# Patient Record
Sex: Female | Born: 1955 | Race: White | Hispanic: No | Marital: Single | Smoking: Former smoker
Health system: Southern US, Community
[De-identification: ages and names within clinical notes are randomized; demographics above are authoritative.]

## PROBLEM LIST (undated history)

## (undated) DIAGNOSIS — C801 Malignant (primary) neoplasm, unspecified: Secondary | ICD-10-CM

## (undated) DIAGNOSIS — E039 Hypothyroidism, unspecified: Secondary | ICD-10-CM

## (undated) DIAGNOSIS — M1990 Unspecified osteoarthritis, unspecified site: Secondary | ICD-10-CM

## (undated) DIAGNOSIS — J189 Pneumonia, unspecified organism: Secondary | ICD-10-CM

## (undated) DIAGNOSIS — L409 Psoriasis, unspecified: Secondary | ICD-10-CM

## (undated) DIAGNOSIS — Z803 Family history of malignant neoplasm of breast: Secondary | ICD-10-CM

## (undated) DIAGNOSIS — F419 Anxiety disorder, unspecified: Secondary | ICD-10-CM

## (undated) DIAGNOSIS — S32050A Wedge compression fracture of fifth lumbar vertebra, initial encounter for closed fracture: Secondary | ICD-10-CM

## (undated) DIAGNOSIS — Z87442 Personal history of urinary calculi: Secondary | ICD-10-CM

## (undated) DIAGNOSIS — F32A Depression, unspecified: Secondary | ICD-10-CM

## (undated) DIAGNOSIS — K8020 Calculus of gallbladder without cholecystitis without obstruction: Secondary | ICD-10-CM

## (undated) DIAGNOSIS — Z7982 Long term (current) use of aspirin: Secondary | ICD-10-CM

## (undated) HISTORY — PX: WISDOM TOOTH EXTRACTION: SHX21

## (undated) HISTORY — DX: Depression, unspecified: F32.A

## (undated) HISTORY — PX: BREAST SURGERY: SHX581

## (undated) HISTORY — DX: Anxiety disorder, unspecified: F41.9

## (undated) HISTORY — PX: COLONOSCOPY: SHX174

## (undated) HISTORY — DX: Psoriasis, unspecified: L40.9

## (undated) HISTORY — PX: BACK SURGERY: SHX140

## (undated) HISTORY — DX: Family history of malignant neoplasm of breast: Z80.3

---
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
## 1978-10-18 HISTORY — PX: OTHER SURGICAL HISTORY: SHX169

## 1978-10-18 HISTORY — PX: DILATION AND CURETTAGE OF UTERUS: SHX78

## 1982-10-18 DIAGNOSIS — R87619 Unspecified abnormal cytological findings in specimens from cervix uteri: Secondary | ICD-10-CM

## 1982-10-18 HISTORY — DX: Unspecified abnormal cytological findings in specimens from cervix uteri: R87.619

## 2003-10-13 ENCOUNTER — Other Ambulatory Visit: Payer: Self-pay

## 2015-10-19 HISTORY — PX: WRIST FRACTURE SURGERY: SHX121

## 2019-10-19 HISTORY — PX: EYE SURGERY: SHX253

## 2021-02-13 ENCOUNTER — Ambulatory Visit (INDEPENDENT_AMBULATORY_CARE_PROVIDER_SITE_OTHER): Payer: Self-pay | Admitting: Advanced Practice Midwife

## 2021-02-13 ENCOUNTER — Other Ambulatory Visit: Payer: Self-pay

## 2021-02-13 ENCOUNTER — Encounter: Payer: Self-pay | Admitting: Advanced Practice Midwife

## 2021-02-13 VITALS — BP 140/80 | HR 98 | Ht 62.0 in | Wt 140.0 lb

## 2021-02-13 DIAGNOSIS — N951 Menopausal and female climacteric states: Secondary | ICD-10-CM

## 2021-02-13 DIAGNOSIS — F32A Depression, unspecified: Secondary | ICD-10-CM

## 2021-02-13 DIAGNOSIS — F419 Anxiety disorder, unspecified: Secondary | ICD-10-CM

## 2021-02-13 DIAGNOSIS — Z0000 Encounter for general adult medical examination without abnormal findings: Secondary | ICD-10-CM

## 2021-02-13 DIAGNOSIS — E039 Hypothyroidism, unspecified: Secondary | ICD-10-CM

## 2021-02-13 MED ORDER — CLONAZEPAM 1 MG PO TABS: 1.0000 mg | ORAL_TABLET | Freq: Two times a day (BID) | ORAL | 5 refills | Status: DC | PRN

## 2021-02-13 MED ORDER — LEVOTHYROXINE SODIUM 75 MCG PO TABS: 75.0000 ug | ORAL_TABLET | Freq: Every day | ORAL | 11 refills | Status: AC

## 2021-02-13 MED ORDER — MEDROXYPROGESTERONE ACETATE 2.5 MG PO TABS: 2.5000 mg | ORAL_TABLET | Freq: Every day | ORAL | 11 refills | Status: DC

## 2021-02-13 MED ORDER — DULOXETINE HCL 60 MG PO CPEP: 60.0000 mg | ORAL_CAPSULE | Freq: Every day | ORAL | 11 refills | Status: AC

## 2021-02-13 MED ORDER — EST ESTROGENS-METHYLTEST 1.25-2.5 MG PO TABS: 1.0000 | ORAL_TABLET | Freq: Every day | ORAL | 5 refills | Status: DC

## 2021-02-13 NOTE — Progress Notes (Signed)
Patient ID: Amanda Gonzales, female   DOB: 20-Nov-1955, 65 y.o.   MRN: [id number]    Subjective:  Date of Service: 02/13/2021  HPI:  Amanda Gonzales is a 65 y.o. G3 P2012 postmenopausal female being seen to establish care for the primary purpose at this time of receiving refills of her long-term medications. She has moved to the area from [place] where she was seen by [facility] for her ongoing care. Her main concerns are her anxiety, depression and menopausal symptoms-specifically hot flashes. She also has a history of hypothyroid.   She has been missing doses of most of her medications due to needing refills. She has multiple stressors and an ongoing history of anxiety and depression. She tried weaning off her HRT several years ago without success. She reports taking HRT since age 65. We discussed recommendation to take lowest dose for the shortest period of time due to risks for heart disease and cancer. She is not ready to discontinue HRT.   I also advised the patient that she needs to establish care with a PCP for further refills of mood supporting and thyroid medications as we are an OB/GYN practice doing limited primary care. I also recommended she seek the help of a therapist for ongoing mood disorders and she is not interested in that at this time. Last PAP smear was 06/13/18 and was normal. Last mammogram was in 2020 and was normal. She declines those tests at today's visit.    Past Medical History:  Diagnosis Date  . Abnormal Pap smear of cervix 1984  . Anxiety   . Depression   . Psoriasis    Family History  Problem Relation Age of Onset  . Breast cancer Brother 60  . Stroke Brother   . Thyroid disease Maternal Grandmother   . Heart failure Mother   . Heart failure Father    Past Surgical History:  Procedure Laterality Date  . APPENDECTOMY  1976  . [place]   MAB  . EYE SURGERY  2021   Cataract  . WRIST FRACTURE  SURGERY Left 2017    Short Social History:  Social History   Tobacco Use  . Smoking status: Former Smoker    Types: Cigarettes  . Smokeless tobacco: Never Used  Substance Use Topics  . Alcohol use: Yes    Alcohol/week: 1.0 standard drink    Types: 1 Cans of beer per week    Comment: Occassional    No Known Allergies  Current Outpatient Medications  Medication Sig Dispense Refill  . clonazePAM (KLONOPIN) 1 MG tablet Take 1 tablet (1 mg total) by mouth 2 (two) times daily as needed. 30 tablet 5  . DULoxetine (CYMBALTA) 60 MG capsule Take 1 capsule (60 mg total) by mouth daily. 30 capsule 11  . estrogens-methylTEST (ESTRATEST) 1.25-2.5 MG tablet Take 1 tablet by mouth daily. 30 tablet 5  . levothyroxine (SYNTHROID) 75 MCG tablet Take 1 tablet (75 mcg total) by mouth daily before breakfast. 30 tablet 11  . medroxyPROGESTERone (PROVERA) 2.5 MG tablet Take 1 tablet (2.5 mg total) by mouth daily. 30 tablet 11   No current facility-administered medications for this visit.   Review of Systems  Constitutional: Negative for chills and fever.  HENT: Positive for congestion. Negative for ear discharge, ear pain, hearing loss, sinus pain and sore throat.   Eyes: Negative for blurred vision and double vision.  Respiratory: Positive for cough. Negative for shortness  of breath and wheezing.   Cardiovascular: Negative for chest pain, palpitations and leg swelling.  Gastrointestinal: Negative for abdominal pain, blood in stool, constipation, diarrhea, heartburn, melena, nausea and vomiting.  Genitourinary: Positive for urgency. Negative for dysuria, flank pain, frequency and hematuria.  Musculoskeletal: Negative for back pain, joint pain and myalgias.  Skin: Negative for itching and rash.  Neurological: Negative for dizziness, tingling, tremors, sensory change, speech change, focal weakness, seizures, loss of consciousness, weakness and headaches.  Endo/Heme/Allergies: Negative for environmental  allergies. Does not bruise/bleed easily.       Positive for hot flashes  Psychiatric/Behavioral: Positive for depression. Negative for hallucinations, memory loss, substance abuse and suicidal ideas. The patient is not nervous/anxious and does not have insomnia.        Positive for anxiety       Objective:  Objective   Vitals:   02/13/21 1425  BP: 140/80  Pulse: 98  Weight: 140 lb (63.5 kg)  Height: 5\' 2"  (1.575 m)   Body mass index is 25.61 kg/m. Constitutional: Well nourished, well developed female in no acute physical distress.  HEENT: normal Skin: Warm and dry.  Cardiovascular: Regular rate and rhythm.   Extremity: no edema Respiratory: Clear to auscultation bilateral. Normal respiratory effort Neuro: DTRs 2+, Cranial nerves grossly intact Psych: Alert and Oriented x3. No memory deficits. Nervous/anxious  MS: normal gait, normal bilateral lower extremity ROM/strength/stability.   Data: Depression screen [facility] 2/9 02/13/2021  Decreased Interest 3  Down, Depressed, Hopeless 2  PHQ - 2 Score 5  Altered sleeping 3  Tired, decreased energy 3  Change in appetite 3  Feeling bad or failure about yourself  1  Trouble concentrating 2  Moving slowly or fidgety/restless 1  Suicidal thoughts 0  PHQ-9 Score 18  Difficult doing work/chores Very difficult   GAD 7 : Generalized Anxiety Score 02/13/2021  Nervous, Anxious, on Edge 1  Control/stop worrying 3  Worry too much - different things 3  Trouble relaxing 2  Restless 1  Easily annoyed or irritable 1  Afraid - awful might happen 1  Total GAD 7 Score 12  Anxiety Difficulty Somewhat difficult    Time of visit: 24 minutes were spent face to face in the care of this patient. Greater than 50% spent in counseling. Assessment/Plan:   65 y.o. postmenopausal female establishing care, medication refills  Rxs: Synthroid 75 mg daily before breakfast Klonopin 1 mg 2 times daily PRN Cymbalta 60 mg daily Provera 2.5 mg  daily Estratest 1.25-2.5 mg 1 tablet daily Return to clinic for annual well woman exam with PAP smear and referral for mammogram Establish care with PCP for ongoing thyroid and mental health care   [facility] 02/16/2021, 11:17 AM

## 2021-02-16 ENCOUNTER — Encounter: Payer: Self-pay | Admitting: Advanced Practice Midwife

## 2021-02-16 DIAGNOSIS — F32A Depression, unspecified: Secondary | ICD-10-CM | POA: Insufficient documentation

## 2021-02-16 DIAGNOSIS — E782 Mixed hyperlipidemia: Secondary | ICD-10-CM | POA: Insufficient documentation

## 2021-02-16 DIAGNOSIS — F419 Anxiety disorder, unspecified: Secondary | ICD-10-CM | POA: Insufficient documentation

## 2021-02-16 DIAGNOSIS — E039 Hypothyroidism, unspecified: Secondary | ICD-10-CM | POA: Insufficient documentation

## 2021-02-23 ENCOUNTER — Encounter: Payer: Self-pay | Admitting: Obstetrics and Gynecology

## 2021-04-14 ENCOUNTER — Ambulatory Visit: Payer: Self-pay | Admitting: Dermatology

## 2021-06-12 ENCOUNTER — Other Ambulatory Visit: Payer: Self-pay | Admitting: Internal Medicine

## 2021-06-12 DIAGNOSIS — Z1231 Encounter for screening mammogram for malignant neoplasm of breast: Secondary | ICD-10-CM

## 2021-09-23 ENCOUNTER — Other Ambulatory Visit: Payer: Self-pay | Admitting: Family Medicine

## 2021-09-23 ENCOUNTER — Ambulatory Visit: Payer: Self-pay

## 2021-09-23 ENCOUNTER — Other Ambulatory Visit: Payer: Self-pay | Admitting: Physical Medicine and Rehabilitation

## 2021-09-23 DIAGNOSIS — G44319 Acute post-traumatic headache, not intractable: Secondary | ICD-10-CM

## 2021-09-23 DIAGNOSIS — R519 Headache, unspecified: Secondary | ICD-10-CM

## 2021-09-24 ENCOUNTER — Other Ambulatory Visit: Payer: Self-pay | Admitting: Family Medicine

## 2021-09-24 ENCOUNTER — Ambulatory Visit: Admission: RE | Admit: 2021-09-24 | Payer: Self-pay | Source: Ambulatory Visit

## 2021-09-24 DIAGNOSIS — M4856XA Collapsed vertebra, not elsewhere classified, lumbar region, initial encounter for fracture: Secondary | ICD-10-CM

## 2021-09-28 ENCOUNTER — Other Ambulatory Visit: Payer: Self-pay | Admitting: Internal Medicine

## 2021-09-28 ENCOUNTER — Ambulatory Visit
Admission: RE | Admit: 2021-09-28 | Discharge: 2021-09-28 | Disposition: A | Discharge status: Home / Self Care | Payer: Self-pay | Source: Ambulatory Visit | Attending: Family Medicine | Admitting: Family Medicine

## 2021-09-28 ENCOUNTER — Ambulatory Visit
Admission: RE | Admit: 2021-09-28 | Discharge: 2021-09-28 | Disposition: A | Discharge status: Home / Self Care | Payer: Self-pay | Source: Ambulatory Visit | Attending: Internal Medicine | Admitting: Internal Medicine

## 2021-09-28 DIAGNOSIS — Z1389 Encounter for screening for other disorder: Secondary | ICD-10-CM

## 2021-09-28 DIAGNOSIS — Y939 Activity, unspecified: Secondary | ICD-10-CM | POA: Insufficient documentation

## 2021-09-28 DIAGNOSIS — Y929 Unspecified place or not applicable: Secondary | ICD-10-CM | POA: Insufficient documentation

## 2021-09-28 DIAGNOSIS — M4856XA Collapsed vertebra, not elsewhere classified, lumbar region, initial encounter for fracture: Secondary | ICD-10-CM | POA: Insufficient documentation

## 2021-09-28 DIAGNOSIS — R519 Headache, unspecified: Secondary | ICD-10-CM | POA: Insufficient documentation

## 2021-10-01 ENCOUNTER — Other Ambulatory Visit: Payer: Self-pay | Admitting: Internal Medicine

## 2021-10-01 DIAGNOSIS — Z1231 Encounter for screening mammogram for malignant neoplasm of breast: Secondary | ICD-10-CM

## 2021-10-13 ENCOUNTER — Other Ambulatory Visit: Payer: Self-pay | Admitting: Internal Medicine

## 2021-10-13 DIAGNOSIS — T8543XA Leakage of breast prosthesis and implant, initial encounter: Secondary | ICD-10-CM

## 2021-11-09 ENCOUNTER — Other Ambulatory Visit: Payer: Self-pay | Admitting: Neurosurgery

## 2021-11-09 DIAGNOSIS — S32000A Wedge compression fracture of unspecified lumbar vertebra, initial encounter for closed fracture: Secondary | ICD-10-CM

## 2021-11-12 ENCOUNTER — Ambulatory Visit
Admission: RE | Admit: 2021-11-12 | Discharge: 2021-11-12 | Disposition: A | Discharge status: Home / Self Care | Payer: Self-pay | Source: Ambulatory Visit | Attending: Neurosurgery | Admitting: Neurosurgery

## 2021-11-12 ENCOUNTER — Other Ambulatory Visit: Payer: Self-pay | Admitting: Interventional Radiology

## 2021-11-12 DIAGNOSIS — S32000A Wedge compression fracture of unspecified lumbar vertebra, initial encounter for closed fracture: Secondary | ICD-10-CM

## 2021-11-12 HISTORY — PX: IR RADIOLOGIST EVAL & MGMT: IMG5224
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
## 2021-11-13 ENCOUNTER — Other Ambulatory Visit: Payer: Self-pay | Admitting: Interventional Radiology

## 2021-11-13 DIAGNOSIS — M8458XS Pathological fracture in neoplastic disease, other specified site, sequela: Secondary | ICD-10-CM

## 2021-11-13 DIAGNOSIS — M8458XA Pathological fracture in neoplastic disease, other specified site, initial encounter for fracture: Secondary | ICD-10-CM

## 2021-11-13 DIAGNOSIS — S32050B Wedge compression fracture of fifth lumbar vertebra, initial encounter for open fracture: Secondary | ICD-10-CM

## 2021-11-13 DIAGNOSIS — S32000A Wedge compression fracture of unspecified lumbar vertebra, initial encounter for closed fracture: Secondary | ICD-10-CM

## 2021-11-13 DIAGNOSIS — M8088XA Other osteoporosis with current pathological fracture, vertebra(e), initial encounter for fracture: Secondary | ICD-10-CM

## 2021-11-13 DIAGNOSIS — S32050A Wedge compression fracture of fifth lumbar vertebra, initial encounter for closed fracture: Secondary | ICD-10-CM

## 2021-11-16 NOTE — Progress Notes (Signed)
Patient on schedule for Kyphoplasty/Lumbar 11/20/2021, called and spoke with patient on phone with pre procedure instructions given.made aware to be here @ 0730, NPO after MN, as well as driver post procedure/recovery/discharge. Plan on being here for procedure/recovery at least 4 hours. Stated understanding.

## 2021-11-19 ENCOUNTER — Other Ambulatory Visit: Payer: Self-pay | Admitting: Student
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
## 2021-11-20 ENCOUNTER — Ambulatory Visit
Admission: RE | Admit: 2021-11-20 | Discharge: 2021-11-20 | Disposition: A | Discharge status: Home / Self Care | Payer: 59 | Source: Ambulatory Visit | Attending: Interventional Radiology | Admitting: Interventional Radiology

## 2021-11-20 ENCOUNTER — Encounter: Payer: Self-pay | Admitting: Radiology

## 2021-11-20 ENCOUNTER — Other Ambulatory Visit: Payer: Self-pay

## 2021-11-20 DIAGNOSIS — S32000A Wedge compression fracture of unspecified lumbar vertebra, initial encounter for closed fracture: Secondary | ICD-10-CM

## 2021-11-20 DIAGNOSIS — M8088XA Other osteoporosis with current pathological fracture, vertebra(e), initial encounter for fracture: Secondary | ICD-10-CM

## 2021-11-20 DIAGNOSIS — M4856XA Collapsed vertebra, not elsewhere classified, lumbar region, initial encounter for fracture: Secondary | ICD-10-CM | POA: Insufficient documentation

## 2021-11-20 HISTORY — PX: IR KYPHO LUMBAR INC FX REDUCE BONE BX UNI/BIL CANNULATION INC/IMAGING: IMG5519

## 2021-11-20 LAB — BASIC METABOLIC PANEL
Anion gap: 8 (ref 5–15)
BUN: 18 mg/dL (ref 8–23)
CO2: 25 mmol/L (ref 22–32)
Calcium: 8.8 mg/dL — ABNORMAL LOW (ref 8.9–10.3)
Chloride: 106 mmol/L (ref 98–111)
Creatinine, Ser: 0.78 mg/dL (ref 0.44–1.00)
GFR, Estimated: >60 mL/min (ref >60)
Glucose, Bld: 106 mg/dL — ABNORMAL HIGH (ref 70–99)
Potassium: 4 mmol/L (ref 3.5–5.1)
Sodium: 139 mmol/L (ref 135–145)

## 2021-11-20 LAB — CBC
HCT: 42.9 % (ref 36.0–46.0)
Hemoglobin: 14.7 g/dL (ref 12.0–15.0)
MCH: 32 pg (ref 26.0–34.0)
MCHC: 34.3 g/dL (ref 30.0–36.0)
MCV: 93.3 fL (ref 80.0–100.0)
Platelets: 320 10*3/uL (ref 150–400)
RBC: 4.6 MIL/uL (ref 3.87–5.11)
RDW: 12.9 % (ref 11.5–15.5)
WBC: 7.5 10*3/uL (ref 4.0–10.5)
nRBC: 0 % (ref 0.0–0.2)

## 2021-11-20 LAB — PROTIME-INR
INR: 0.9 (ref 0.8–1.2)
Prothrombin Time: 12.2 seconds (ref 11.4–15.2)

## 2021-11-20 MED ORDER — MIDAZOLAM HCL 2 MG/2ML IJ SOLN
INTRAMUSCULAR | Status: AC
  Filled 2021-11-20: qty 2

## 2021-11-20 MED ORDER — FENTANYL CITRATE (PF) 100 MCG/2ML IJ SOLN
INTRAMUSCULAR | Status: AC
  Filled 2021-11-20: qty 2

## 2021-11-20 MED ORDER — HYDROCODONE-ACETAMINOPHEN 5-325 MG PO TABS
ORAL_TABLET | ORAL | Status: AC
  Filled 2021-11-20: qty 1

## 2021-11-20 MED ORDER — MIDAZOLAM HCL 2 MG/2ML IJ SOLN
INTRAMUSCULAR | Status: AC | PRN
  Administered 2021-11-20 (×3): 1 mg via INTRAVENOUS

## 2021-11-20 MED ORDER — CEFAZOLIN SODIUM-DEXTROSE 2-4 GM/100ML-% IV SOLN
2.0000 g | INTRAVENOUS | Status: AC
  Administered 2021-11-20: 2 g via INTRAVENOUS
  Filled 2021-11-20: qty 100

## 2021-11-20 MED ORDER — HYDROCODONE-ACETAMINOPHEN 5-325 MG PO TABS
1.0000 | ORAL_TABLET | Freq: Once | ORAL | Status: AC
  Administered 2021-11-20: 1 via ORAL
  Filled 2021-11-20: qty 1

## 2021-11-20 MED ORDER — LIDOCAINE HCL (PF) 1 % IJ SOLN
INTRAMUSCULAR | Status: AC
  Administered 2021-11-20: 30 mL
  Filled 2021-11-20: qty 30

## 2021-11-20 MED ORDER — SODIUM CHLORIDE 0.9 % IV SOLN
INTRAVENOUS | Status: DC
  Filled 2021-11-20: qty 1000

## 2021-11-20 MED ORDER — FENTANYL CITRATE (PF) 100 MCG/2ML IJ SOLN
INTRAMUSCULAR | Status: AC | PRN
  Administered 2021-11-20: 50 ug via INTRAVENOUS
  Administered 2021-11-20: 25 ug via INTRAVENOUS
  Administered 2021-11-20: 50 ug via INTRAVENOUS
  Administered 2021-11-20: 25 ug via INTRAVENOUS
  Administered 2021-11-20: 50 ug via INTRAVENOUS

## 2021-11-20 MED ORDER — CEFAZOLIN SODIUM-DEXTROSE 2-4 GM/100ML-% IV SOLN
INTRAVENOUS | Status: AC
  Filled 2021-11-20: qty 100

## 2021-11-20 NOTE — Procedures (Signed)
Interventional Radiology Procedure Note  Date of Procedure: 11/20/2021  Procedure: L5 kyphoplasty   Findings:  1. Successful L5 kyphoplasty using a bipedicular approach    Complications: No immediate complications noted.   Estimated Blood Loss: minimal  Follow-up and Recommendations: 1. Bedrest 2 hours    Albin Felling, MD  Vascular & Interventional Radiology  11/20/2021 10:10 AM

## 2021-11-20 NOTE — Progress Notes (Signed)
Patient clinically stable post L5 Kypholplasty, awake entire procedure despite received Versed 3 mg along with Fentanyl 200 mcg IV for procedure with functional iv. Vitals stable.  Successful procedue per DR El-Abd. Report given at bedside to [place] in specials.

## 2021-11-23 ENCOUNTER — Other Ambulatory Visit: Payer: Self-pay | Admitting: Interventional Radiology

## 2021-11-23 DIAGNOSIS — S32000A Wedge compression fracture of unspecified lumbar vertebra, initial encounter for closed fracture: Secondary | ICD-10-CM
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
## 2021-11-26 ENCOUNTER — Other Ambulatory Visit: Payer: Self-pay | Admitting: Advanced Practice Midwife

## 2021-11-26 ENCOUNTER — Other Ambulatory Visit: Payer: Self-pay

## 2021-11-26 DIAGNOSIS — Z0000 Encounter for general adult medical examination without abnormal findings: Secondary | ICD-10-CM

## 2021-11-26 DIAGNOSIS — N951 Menopausal and female climacteric states: Secondary | ICD-10-CM

## 2021-11-26 MED ORDER — EST ESTROGENS-METHYLTEST DS 1.25-2.5 MG PO TABS: 1.0000 | ORAL_TABLET | Freq: Every day | ORAL | 0 refills | Status: DC

## 2021-12-01 ENCOUNTER — Other Ambulatory Visit: Payer: Self-pay | Admitting: Advanced Practice Midwife

## 2021-12-01 DIAGNOSIS — N951 Menopausal and female climacteric states: Secondary | ICD-10-CM

## 2021-12-01 DIAGNOSIS — Z0000 Encounter for general adult medical examination without abnormal findings: Secondary | ICD-10-CM

## 2021-12-03 ENCOUNTER — Other Ambulatory Visit: Payer: Self-pay

## 2021-12-03 ENCOUNTER — Other Ambulatory Visit: Payer: Self-pay | Admitting: Advanced Practice Midwife

## 2021-12-03 ENCOUNTER — Ambulatory Visit
Admission: RE | Admit: 2021-12-03 | Discharge: 2021-12-03 | Disposition: A | Discharge status: Home / Self Care | Payer: 59 | Source: Ambulatory Visit | Attending: Interventional Radiology | Admitting: Interventional Radiology

## 2021-12-03 DIAGNOSIS — Z0000 Encounter for general adult medical examination without abnormal findings: Secondary | ICD-10-CM

## 2021-12-03 DIAGNOSIS — N951 Menopausal and female climacteric states: Secondary | ICD-10-CM

## 2021-12-03 DIAGNOSIS — S32000A Wedge compression fracture of unspecified lumbar vertebra, initial encounter for closed fracture: Secondary | ICD-10-CM

## 2021-12-03 HISTORY — PX: IR RADIOLOGIST EVAL & MGMT: IMG5224
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
## 2021-12-04 ENCOUNTER — Ambulatory Visit: Payer: Self-pay

## 2021-12-04 ENCOUNTER — Other Ambulatory Visit: Payer: Self-pay

## 2021-12-04 ENCOUNTER — Ambulatory Visit
Admission: RE | Admit: 2021-12-04 | Discharge: 2021-12-04 | Disposition: A | Discharge status: Home / Self Care | Payer: 59 | Source: Ambulatory Visit | Attending: Internal Medicine | Admitting: Internal Medicine

## 2021-12-04 ENCOUNTER — Other Ambulatory Visit: Payer: Self-pay | Admitting: Advanced Practice Midwife

## 2021-12-04 DIAGNOSIS — Z0000 Encounter for general adult medical examination without abnormal findings: Secondary | ICD-10-CM

## 2021-12-04 DIAGNOSIS — N951 Menopausal and female climacteric states: Secondary | ICD-10-CM

## 2021-12-04 DIAGNOSIS — T8543XA Leakage of breast prosthesis and implant, initial encounter: Secondary | ICD-10-CM

## 2021-12-04 MED ORDER — EST ESTROGENS-METHYLTEST DS 1.25-2.5 MG PO TABS: 1.0000 | ORAL_TABLET | Freq: Every day | ORAL | 0 refills | Status: DC

## 2021-12-04 NOTE — Progress Notes (Signed)
Refill methyltest 1 month supply

## 2021-12-14 ENCOUNTER — Other Ambulatory Visit: Payer: Self-pay

## 2021-12-17 ENCOUNTER — Other Ambulatory Visit: Payer: Self-pay | Admitting: Internal Medicine

## 2021-12-17 DIAGNOSIS — T8543XA Leakage of breast prosthesis and implant, initial encounter: Secondary | ICD-10-CM

## 2021-12-18 ENCOUNTER — Other Ambulatory Visit: Payer: Self-pay | Admitting: Neurosurgery

## 2021-12-18 DIAGNOSIS — S32050D Wedge compression fracture of fifth lumbar vertebra, subsequent encounter for fracture with routine healing: Secondary | ICD-10-CM

## 2021-12-24 ENCOUNTER — Other Ambulatory Visit: Payer: Self-pay

## 2021-12-24 ENCOUNTER — Ambulatory Visit (INDEPENDENT_AMBULATORY_CARE_PROVIDER_SITE_OTHER): Payer: Self-pay | Admitting: Plastic Surgery

## 2021-12-24 VITALS — BP 152/84 | HR 97 | Ht 62.0 in | Wt 143.0 lb

## 2021-12-24 DIAGNOSIS — T8543XA Leakage of breast prosthesis and implant, initial encounter: Secondary | ICD-10-CM

## 2021-12-24 DIAGNOSIS — Z719 Counseling, unspecified: Secondary | ICD-10-CM
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
## 2021-12-25 ENCOUNTER — Ambulatory Visit
Admission: RE | Admit: 2021-12-25 | Discharge: 2021-12-25 | Disposition: A | Discharge status: Home / Self Care | Payer: Medicare Other | Source: Ambulatory Visit | Attending: Neurosurgery | Admitting: Neurosurgery

## 2021-12-25 DIAGNOSIS — S32050D Wedge compression fracture of fifth lumbar vertebra, subsequent encounter for fracture with routine healing: Secondary | ICD-10-CM | POA: Diagnosis not present
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
## 2022-01-02 ENCOUNTER — Other Ambulatory Visit: Payer: Self-pay | Admitting: Advanced Practice Midwife

## 2022-01-02 DIAGNOSIS — Z0000 Encounter for general adult medical examination without abnormal findings: Secondary | ICD-10-CM

## 2022-01-02 DIAGNOSIS — N951 Menopausal and female climacteric states: Secondary | ICD-10-CM

## 2022-01-06 ENCOUNTER — Other Ambulatory Visit: Payer: Self-pay | Admitting: Advanced Practice Midwife

## 2022-01-06 ENCOUNTER — Encounter: Payer: 59 | Admitting: Physician Assistant

## 2022-01-06 DIAGNOSIS — N951 Menopausal and female climacteric states: Secondary | ICD-10-CM

## 2022-01-06 DIAGNOSIS — Z0000 Encounter for general adult medical examination without abnormal findings: Secondary | ICD-10-CM

## 2022-01-20 ENCOUNTER — Encounter: Payer: 59 | Admitting: Plastic Surgery

## 2022-02-04 ENCOUNTER — Telehealth: Payer: Self-pay

## 2022-02-04 NOTE — Telephone Encounter (Signed)
Successfully faxed requested information to Maryan Char at [phone] on 02/04/2022. ?

## 2022-02-15 HISTORY — PX: OTHER SURGICAL HISTORY: SHX169

## 2022-03-01 ENCOUNTER — Other Ambulatory Visit: Payer: Self-pay | Admitting: Neurosurgery

## 2022-03-01 DIAGNOSIS — Z01818 Encounter for other preprocedural examination: Secondary | ICD-10-CM

## 2022-03-05 ENCOUNTER — Encounter
Admission: RE | Admit: 2022-03-05 | Discharge: 2022-03-05 | Disposition: A | Discharge status: Home / Self Care | Payer: Medicare Other | Source: Ambulatory Visit | Attending: Neurosurgery | Admitting: Neurosurgery

## 2022-03-05 VITALS — BP 140/94 | Resp 18

## 2022-03-05 DIAGNOSIS — Z01818 Encounter for other preprocedural examination: Secondary | ICD-10-CM

## 2022-03-05 DIAGNOSIS — Z01812 Encounter for preprocedural laboratory examination: Secondary | ICD-10-CM | POA: Insufficient documentation

## 2022-03-05 DIAGNOSIS — Z01810 Encounter for preprocedural cardiovascular examination: Secondary | ICD-10-CM

## 2022-03-05 DIAGNOSIS — Z7982 Long term (current) use of aspirin: Secondary | ICD-10-CM

## 2022-03-05 DIAGNOSIS — Z79899 Other long term (current) drug therapy: Secondary | ICD-10-CM | POA: Insufficient documentation

## 2022-03-05 DIAGNOSIS — E039 Hypothyroidism, unspecified: Secondary | ICD-10-CM

## 2022-03-05 DIAGNOSIS — S32050A Wedge compression fracture of fifth lumbar vertebra, initial encounter for closed fracture: Secondary | ICD-10-CM

## 2022-03-05 HISTORY — DX: Unspecified osteoarthritis, unspecified site: M19.90

## 2022-03-05 HISTORY — DX: Wedge compression fracture of fifth lumbar vertebra, initial encounter for closed fracture: S32.050A

## 2022-03-05 HISTORY — DX: Personal history of urinary calculi: Z87.442

## 2022-03-05 HISTORY — DX: Long term (current) use of aspirin: Z79.82

## 2022-03-05 HISTORY — DX: Hypothyroidism, unspecified: E03.9

## 2022-03-05 HISTORY — DX: Malignant (primary) neoplasm, unspecified: C80.1

## 2022-03-05 HISTORY — DX: Pneumonia, unspecified organism: J18.9

## 2022-03-05 LAB — CBC
HCT: 46.8 % — ABNORMAL HIGH (ref 36.0–46.0)
Hemoglobin: 15.7 g/dL — ABNORMAL HIGH (ref 12.0–15.0)
MCH: 31.5 pg (ref 26.0–34.0)
MCHC: 33.5 g/dL (ref 30.0–36.0)
MCV: 94 fL (ref 80.0–100.0)
Platelets: 371 10*3/uL (ref 150–400)
RBC: 4.98 MIL/uL (ref 3.87–5.11)
RDW: 13.1 % (ref 11.5–15.5)
WBC: 7.6 10*3/uL (ref 4.0–10.5)
nRBC: 0 % (ref 0.0–0.2)

## 2022-03-05 LAB — TYPE AND SCREEN
ABO/RH(D): O POS
Antibody Screen: NEGATIVE

## 2022-03-05 LAB — URINALYSIS, COMPLETE (UACMP) WITH MICROSCOPIC
Bacteria, UA: NONE SEEN
Bilirubin Urine: NEGATIVE
Glucose, UA: NEGATIVE mg/dL
Hgb urine dipstick: NEGATIVE
Ketones, ur: NEGATIVE mg/dL
Leukocytes,Ua: NEGATIVE
Nitrite: NEGATIVE
Protein, ur: NEGATIVE mg/dL
Specific Gravity, Urine: 1.005 (ref 1.005–1.030)
pH: 6 (ref 5.0–8.0)

## 2022-03-05 LAB — BASIC METABOLIC PANEL
Anion gap: 8 (ref 5–15)
BUN: 11 mg/dL (ref 8–23)
CO2: 26 mmol/L (ref 22–32)
Calcium: 9 mg/dL (ref 8.9–10.3)
Chloride: 105 mmol/L (ref 98–111)
Creatinine, Ser: 0.87 mg/dL (ref 0.44–1.00)
GFR, Estimated: >60 mL/min (ref >60)
Glucose, Bld: 98 mg/dL (ref 70–99)
Potassium: 3.9 mmol/L (ref 3.5–5.1)
Sodium: 139 mmol/L (ref 135–145)

## 2022-03-05 NOTE — Patient Instructions (Addendum)
Your procedure is scheduled on: 03/17/22 - Wednesday Report to the Registration Desk on the 1st floor of the [facility]. To find out your arrival time, please call [phone] between 1PM - 3PM on: 03/16/22 - Tuesday If your arrival time is 6:00 am, do not arrive prior to that time as the [facility] entrance doors do not open until 6:00 am.  REMEMBER: Instructions that are not followed completely may result in serious medical risk, up to and including death; or upon the discretion of your surgeon and anesthesiologist your surgery may need to be rescheduled.  Do not eat food after midnight the night before surgery.  No gum chewing, lozengers or hard candies.  You may however, drink CLEAR liquids up to 2 hours before you are scheduled to arrive for your surgery. Do not drink anything within 2 hours of your scheduled arrival time.  Clear liquids include: - water  - apple juice without pulp - gatorade (not RED colors) - black coffee or tea (Do NOT add milk or creamers to the coffee or tea) Do NOT drink anything that is not on this list.  TAKE THESE MEDICATIONS THE MORNING OF SURGERY WITH A SIP OF WATER:  - DULoxetine (CYMBALTA) 60 MG capsule  - levothyroxine (SYNTHROID) 75 MCG tablet  One week prior to surgery: Stop Anti-inflammatories (NSAIDS) such as Advil, Aleve, Ibuprofen, Motrin, Naproxen, Naprosyn and Aspirin based products such as Excedrin, Goodys Powder, BC Powder.  Stop ANY OVER THE COUNTER supplements until after surgery.  You may take Tylenol if needed for pain up until the day of surgery.  No Alcohol for 24 hours before or after surgery.  No Smoking including e-cigarettes for 24 hours prior to surgery.  No chewable tobacco products for at least 6 hours prior to surgery.  No nicotine patches on the day of surgery.  Do not use any "recreational" drugs for at least a week prior to your surgery.  Please be advised that the combination of cocaine and anesthesia may  have negative outcomes, up to and including death. If you test positive for cocaine, your surgery will be cancelled.  On the morning of surgery brush your teeth with toothpaste and water, you may rinse your mouth with mouthwash if you wish. Do not swallow any toothpaste or mouthwash.  Use CHG Soap or wipes as directed on instruction sheet.  Do not wear jewelry, make-up, hairpins, clips or nail polish.  Do not wear lotions, powders, or perfumes.   Do not shave body from the neck down 48 hours prior to surgery just in case you cut yourself which could leave a site for infection.  Also, freshly shaved skin may become irritated if using the CHG soap.  Contact lenses, hearing aids and dentures may not be worn into surgery.  Do not bring valuables to the hospital. [facility] is not responsible for any missing/lost belongings or valuables.   Notify your doctor if there is any change in your medical condition (cold, fever, infection).  Wear comfortable clothing (specific to your surgery type) to the hospital.  After surgery, you can help prevent lung complications by doing breathing exercises.  Take deep breaths and cough every 1-2 hours. Your doctor may order a device called an Incentive Spirometer to help you take deep breaths. When coughing or sneezing, hold a pillow firmly against your incision with both hands. This is called "splinting." Doing this helps protect your incision. It also decreases belly discomfort.  If you are being admitted  to the hospital overnight, leave your suitcase in the car. After surgery it may be brought to your room.  If you are being discharged the day of surgery, you will not be allowed to drive home. You will need a responsible adult (18 years or older) to drive you home and stay with you that night.   If you are taking public transportation, you will need to have a responsible adult (18 years or older) with you. Please confirm with your physician that it  is acceptable to use public transportation.   Please call the [facility] at [phone] if you have any questions about these instructions.  Surgery Visitation Policy:  Patients undergoing a surgery or procedure may have two family members or support persons with them as long as the person is not COVID-19 positive or experiencing its symptoms.   Inpatient Visitation:    Visiting hours are 7 a.m. to 8 p.m. Up to four visitors are allowed at one time in a patient room, including children. The visitors may rotate out with other people during the day. One designated support person (adult) may remain overnight.

## 2022-03-08 LAB — SURGICAL PCR SCREEN
MRSA, PCR: POSITIVE — AB
Staphylococcus aureus: POSITIVE — AB

## 2022-03-08 NOTE — Progress Notes (Signed)
  Perioperative Services  Abnormal Lab Notification and Treatment Plan of Care  Date: 03/08/22  Name: Amanda Gonzales MRN:   [id number]  Re: Abnormal labs noted during PAT appointment  Provider Notified: Meade Maw, MD Notification mode: Routed and/or faxed via [place] of concern: Lab Results  Component Value Date   STAPHAUREUS POSITIVE (A) 03/05/2022   MRSAPCR POSITIVE (A) 03/05/2022    Notes: Patient is scheduled for a LEFT L5-S1 FAR LATERAL DISCECTOMY (Left) on 03/17/2022. She is scheduled to receive CEFAZOLIN pre-operatively. Surgical PCR (+) for MRSA; see above.  PLANS:  Review renal function. CrCl cannot be calculated (Unknown ideal weight.). Review allergies. No documented allergy to vancomycin. Order added for VANCOMYCIN 1 GRAM IV to current preoperative prophylactic regimen.  Patient with orders for both CEFAZOLIN + VANCOMYCIN to be given in the setting of documented MRSA (+) surgical PCR.   Guidelines suggest that a beta-lactam antibiotic (first or second generation cephalosporin) should be added for activity against gram-negative organisms.  Vancomycin appears to be less effective than cefazolin for preventing SSIs caused by MSSA. For this reason, the use of vancomycin in combination with cefazolin is favored for prevention of SSI due to MRSA and coagulase-negative staphylococci.  Notify primary attending surgeon of (+) MRSA result and that order has been placed for the Vancomycin by perioperative APP.   Honor Loh, MSN, APRN, FNP-C, CEN [facility]  Peri-operative Services Nurse Practitioner Phone: [phone] 03/08/22 8:41 AM

## 2022-03-17 ENCOUNTER — Encounter: Payer: Self-pay | Admitting: Neurosurgery

## 2022-03-17 ENCOUNTER — Ambulatory Visit: Payer: Medicare Other | Admitting: Urgent Care

## 2022-03-17 ENCOUNTER — Ambulatory Visit: Payer: Medicare Other

## 2022-03-17 ENCOUNTER — Other Ambulatory Visit: Payer: Self-pay

## 2022-03-17 ENCOUNTER — Encounter
Admission: RE | Disposition: A | Discharge status: Home / Self Care | Payer: Self-pay | Source: Ambulatory Visit | Attending: Neurosurgery

## 2022-03-17 ENCOUNTER — Ambulatory Visit
Admission: RE | Admit: 2022-03-17 | Discharge: 2022-03-17 | Disposition: A | Discharge status: Home / Self Care | Payer: Medicare Other | Source: Ambulatory Visit | Attending: Neurosurgery | Admitting: Neurosurgery

## 2022-03-17 DIAGNOSIS — M5117 Intervertebral disc disorders with radiculopathy, lumbosacral region: Secondary | ICD-10-CM | POA: Insufficient documentation

## 2022-03-17 DIAGNOSIS — Z87891 Personal history of nicotine dependence: Secondary | ICD-10-CM | POA: Diagnosis not present

## 2022-03-17 DIAGNOSIS — Z01810 Encounter for preprocedural cardiovascular examination: Secondary | ICD-10-CM

## 2022-03-17 DIAGNOSIS — Z7982 Long term (current) use of aspirin: Secondary | ICD-10-CM

## 2022-03-17 DIAGNOSIS — Z01812 Encounter for preprocedural laboratory examination: Secondary | ICD-10-CM

## 2022-03-17 DIAGNOSIS — M5416 Radiculopathy, lumbar region: Secondary | ICD-10-CM | POA: Diagnosis present

## 2022-03-17 DIAGNOSIS — S32050A Wedge compression fracture of fifth lumbar vertebra, initial encounter for closed fracture: Secondary | ICD-10-CM

## 2022-03-17 DIAGNOSIS — E039 Hypothyroidism, unspecified: Secondary | ICD-10-CM

## 2022-03-17 LAB — ABO/RH: ABO/RH(D): O POS

## 2022-03-17 SURGERY — FAR LATERAL DECOMPRESSION 1 LEVEL
Anesthesia: General | Site: Back | Laterality: Left

## 2022-03-17 MED ORDER — SODIUM CHLORIDE (PF) 0.9 % IJ SOLN
INTRAMUSCULAR | Status: DC | PRN
  Administered 2022-03-17: 60 mL

## 2022-03-17 MED ORDER — EPHEDRINE 5 MG/ML INJ
INTRAVENOUS | Status: AC
  Filled 2022-03-17: qty 5

## 2022-03-17 MED ORDER — ONDANSETRON HCL 4 MG/2ML IJ SOLN
4.0000 mg | Freq: Once | INTRAMUSCULAR | Status: AC | PRN
  Administered 2022-03-17: 4 mg via INTRAVENOUS

## 2022-03-17 MED ORDER — KETAMINE HCL 50 MG/5ML IJ SOSY
PREFILLED_SYRINGE | INTRAMUSCULAR | Status: AC
  Filled 2022-03-17: qty 5

## 2022-03-17 MED ORDER — LACTATED RINGERS IV SOLN: INTRAVENOUS | Status: DC

## 2022-03-17 MED ORDER — OXYCODONE HCL 5 MG PO TABS
ORAL_TABLET | ORAL | Status: AC
  Filled 2022-03-17: qty 1

## 2022-03-17 MED ORDER — SURGIFLO WITH THROMBIN (HEMOSTATIC MATRIX KIT) OPTIME
TOPICAL | Status: DC | PRN
  Administered 2022-03-17: 1 via TOPICAL

## 2022-03-17 MED ORDER — DEXAMETHASONE SODIUM PHOSPHATE 10 MG/ML IJ SOLN
INTRAMUSCULAR | Status: AC
  Filled 2022-03-17: qty 1

## 2022-03-17 MED ORDER — SENNA 8.6 MG PO TABS: 1.0000 | ORAL_TABLET | Freq: Every day | ORAL | 0 refills | Status: DC | PRN

## 2022-03-17 MED ORDER — ROCURONIUM BROMIDE 100 MG/10ML IV SOLN
INTRAVENOUS | Status: DC | PRN
  Administered 2022-03-17: 50 mg via INTRAVENOUS

## 2022-03-17 MED ORDER — MIDAZOLAM HCL 2 MG/2ML IJ SOLN
INTRAMUSCULAR | Status: DC | PRN
  Administered 2022-03-17: 2 mg via INTRAVENOUS

## 2022-03-17 MED ORDER — LIDOCAINE HCL (CARDIAC) PF 100 MG/5ML IV SOSY
PREFILLED_SYRINGE | INTRAVENOUS | Status: DC | PRN
  Administered 2022-03-17: 60 mg via INTRAVENOUS

## 2022-03-17 MED ORDER — DEXAMETHASONE SODIUM PHOSPHATE 10 MG/ML IJ SOLN
INTRAMUSCULAR | Status: DC | PRN
  Administered 2022-03-17: 10 mg via INTRAVENOUS

## 2022-03-17 MED ORDER — ONDANSETRON HCL 4 MG/2ML IJ SOLN
INTRAMUSCULAR | Status: AC
  Filled 2022-03-17: qty 2

## 2022-03-17 MED ORDER — FENTANYL CITRATE (PF) 100 MCG/2ML IJ SOLN
INTRAMUSCULAR | Status: DC | PRN
Start: 2022-03-17 — End: 2022-03-17
  Administered 2022-03-17 (×2): 50 ug via INTRAVENOUS

## 2022-03-17 MED ORDER — VANCOMYCIN HCL IN DEXTROSE 1-5 GM/200ML-% IV SOLN
1000.0000 mg | Freq: Once | INTRAVENOUS | Status: AC
  Administered 2022-03-17: 1000 mg via INTRAVENOUS

## 2022-03-17 MED ORDER — BUPIVACAINE-EPINEPHRINE (PF) 0.5% -1:200000 IJ SOLN
INTRAMUSCULAR | Status: DC | PRN
  Administered 2022-03-17: 4 mL

## 2022-03-17 MED ORDER — ONDANSETRON HCL 4 MG/2ML IJ SOLN
INTRAMUSCULAR | Status: DC | PRN
  Administered 2022-03-17: 4 mg via INTRAVENOUS

## 2022-03-17 MED ORDER — CEFAZOLIN SODIUM-DEXTROSE 2-4 GM/100ML-% IV SOLN
2.0000 g | INTRAVENOUS | Status: AC
Start: 2022-03-17 — End: 2022-03-17
  Administered 2022-03-17: 2 g via INTRAVENOUS

## 2022-03-17 MED ORDER — OXYCODONE-ACETAMINOPHEN 5-325 MG PO TABS: 1.0000 | ORAL_TABLET | ORAL | 0 refills | Status: AC | PRN

## 2022-03-17 MED ORDER — DEXAMETHASONE SODIUM PHOSPHATE 10 MG/ML IJ SOLN
INTRAMUSCULAR | Status: AC
Start: 2022-03-17 — End: ?
  Filled 2022-03-17: qty 1

## 2022-03-17 MED ORDER — FAMOTIDINE 20 MG PO TABS: 20.0000 mg | ORAL_TABLET | Freq: Once | ORAL | Status: AC

## 2022-03-17 MED ORDER — PHENYLEPHRINE 80 MCG/ML (10ML) SYRINGE FOR IV PUSH (FOR BLOOD PRESSURE SUPPORT)
PREFILLED_SYRINGE | INTRAVENOUS | Status: AC
  Filled 2022-03-17: qty 10

## 2022-03-17 MED ORDER — 0.9 % SODIUM CHLORIDE (POUR BTL) OPTIME
TOPICAL | Status: DC | PRN
  Administered 2022-03-17: 500 mL

## 2022-03-17 MED ORDER — MIDAZOLAM HCL 2 MG/2ML IJ SOLN
INTRAMUSCULAR | Status: AC
Start: 2022-03-17 — End: ?
  Filled 2022-03-17: qty 2

## 2022-03-17 MED ORDER — PHENYLEPHRINE HCL (PRESSORS) 10 MG/ML IV SOLN
INTRAVENOUS | Status: DC | PRN
  Administered 2022-03-17 (×4): 160 ug via INTRAVENOUS

## 2022-03-17 MED ORDER — METHYLPREDNISOLONE ACETATE 40 MG/ML IJ SUSP
INTRAMUSCULAR | Status: DC | PRN
  Administered 2022-03-17: 40 mg

## 2022-03-17 MED ORDER — KETAMINE HCL 10 MG/ML IJ SOLN
INTRAMUSCULAR | Status: DC | PRN
  Administered 2022-03-17: 30 mg via INTRAVENOUS

## 2022-03-17 MED ORDER — VANCOMYCIN HCL IN DEXTROSE 1-5 GM/200ML-% IV SOLN
INTRAVENOUS | Status: AC
  Filled 2022-03-17: qty 200

## 2022-03-17 MED ORDER — EPHEDRINE SULFATE (PRESSORS) 50 MG/ML IJ SOLN
INTRAMUSCULAR | Status: DC | PRN
  Administered 2022-03-17 (×2): 10 mg via INTRAVENOUS

## 2022-03-17 MED ORDER — FENTANYL CITRATE (PF) 100 MCG/2ML IJ SOLN
INTRAMUSCULAR | Status: AC
  Filled 2022-03-17: qty 2

## 2022-03-17 MED ORDER — BUPIVACAINE-EPINEPHRINE (PF) 0.5% -1:200000 IJ SOLN
INTRAMUSCULAR | Status: AC
Start: 2022-03-17 — End: ?
  Filled 2022-03-17: qty 30

## 2022-03-17 MED ORDER — OXYCODONE HCL 5 MG/5ML PO SOLN: 5.0000 mg | Freq: Once | ORAL | Status: AC | PRN

## 2022-03-17 MED ORDER — ACETAMINOPHEN 10 MG/ML IV SOLN
1000.0000 mg | Freq: Once | INTRAVENOUS | Status: DC | PRN
  Administered 2022-03-17: 1000 mg via INTRAVENOUS

## 2022-03-17 MED ORDER — FENTANYL CITRATE (PF) 100 MCG/2ML IJ SOLN
INTRAMUSCULAR | Status: AC
  Administered 2022-03-17: 25 ug via INTRAVENOUS
  Filled 2022-03-17: qty 2

## 2022-03-17 MED ORDER — CHLORHEXIDINE GLUCONATE 0.12 % MT SOLN
OROMUCOSAL | Status: AC
  Administered 2022-03-17: 15 mL via OROMUCOSAL
  Filled 2022-03-17: qty 15

## 2022-03-17 MED ORDER — OXYCODONE HCL 5 MG PO TABS
5.0000 mg | ORAL_TABLET | Freq: Once | ORAL | Status: AC | PRN
  Administered 2022-03-17: 5 mg via ORAL

## 2022-03-17 MED ORDER — FAMOTIDINE 20 MG PO TABS
ORAL_TABLET | ORAL | Status: AC
  Administered 2022-03-17: 20 mg via ORAL
  Filled 2022-03-17: qty 1

## 2022-03-17 MED ORDER — ORAL CARE MOUTH RINSE: 15.0000 mL | Freq: Once | OROMUCOSAL | Status: AC

## 2022-03-17 MED ORDER — SUGAMMADEX SODIUM 200 MG/2ML IV SOLN
INTRAVENOUS | Status: DC | PRN
  Administered 2022-03-17: 200 mg via INTRAVENOUS

## 2022-03-17 MED ORDER — BUPIVACAINE HCL 0.5 % IJ SOLN: INTRAMUSCULAR | Status: DC | PRN

## 2022-03-17 MED ORDER — PROPOFOL 10 MG/ML IV BOLUS
INTRAVENOUS | Status: AC
  Filled 2022-03-17: qty 20

## 2022-03-17 MED ORDER — CHLORHEXIDINE GLUCONATE 0.12 % MT SOLN: 15.0000 mL | Freq: Once | OROMUCOSAL | Status: AC

## 2022-03-17 MED ORDER — METHYLPREDNISOLONE ACETATE 40 MG/ML IJ SUSP
INTRAMUSCULAR | Status: AC
  Filled 2022-03-17: qty 1

## 2022-03-17 MED ORDER — ACETAMINOPHEN 10 MG/ML IV SOLN
INTRAVENOUS | Status: AC
  Filled 2022-03-17: qty 100

## 2022-03-17 MED ORDER — PROPOFOL 10 MG/ML IV BOLUS
INTRAVENOUS | Status: DC | PRN
  Administered 2022-03-17: 30 mg via INTRAVENOUS
  Administered 2022-03-17: 50 mg via INTRAVENOUS
  Administered 2022-03-17: 120 mg via INTRAVENOUS

## 2022-03-17 MED ORDER — CEFAZOLIN SODIUM-DEXTROSE 2-4 GM/100ML-% IV SOLN
INTRAVENOUS | Status: AC
  Filled 2022-03-17: qty 100

## 2022-03-17 MED ORDER — FENTANYL CITRATE (PF) 100 MCG/2ML IJ SOLN
25.0000 ug | INTRAMUSCULAR | Status: DC | PRN
  Administered 2022-03-17: 50 ug via INTRAVENOUS
  Administered 2022-03-17: 25 ug via INTRAVENOUS
  Administered 2022-03-17: 50 ug via INTRAVENOUS

## 2022-03-17 MED ORDER — DEXMEDETOMIDINE (PRECEDEX) IN NS 20 MCG/5ML (4 MCG/ML) IV SYRINGE
PREFILLED_SYRINGE | INTRAVENOUS | Status: DC | PRN
  Administered 2022-03-17: 8 ug via INTRAVENOUS

## 2022-03-17 SURGICAL SUPPLY — 62 items
ADH SKN CLS APL DERMABOND .7 (GAUZE/BANDAGES/DRESSINGS) ×1
AGENT HMST KT MTR STRL THRMB (HEMOSTASIS) ×1
APL PRP STRL LF DISP 70% ISPRP (MISCELLANEOUS) ×2
BUR NEURO DRILL SOFT 3.0X3.8M (BURR) ×2 IMPLANT
CHLORAPREP W/TINT 26 (MISCELLANEOUS) ×4 IMPLANT
CNTNR SPEC 2.5X3XGRAD LEK (MISCELLANEOUS) ×1
CONT SPEC 4OZ STER OR WHT (MISCELLANEOUS) ×1
CONT SPEC 4OZ STRL OR WHT (MISCELLANEOUS) ×1
CONTAINER SPEC 2.5X3XGRAD LEK (MISCELLANEOUS) ×1 IMPLANT
COUNTER NEEDLE 20/40 LG (NEEDLE) ×2 IMPLANT
CUP MEDICINE 2OZ PLAST GRAD ST (MISCELLANEOUS) ×4 IMPLANT
DERMABOND ADVANCED (GAUZE/BANDAGES/DRESSINGS) ×1
DERMABOND ADVANCED .7 DNX12 (GAUZE/BANDAGES/DRESSINGS) ×1 IMPLANT
DRAPE C ARM PK CFD 31 SPINE (DRAPES) ×2 IMPLANT
DRAPE LAPAROTOMY 100X77 ABD (DRAPES) ×2 IMPLANT
DRAPE MICROSCOPE SPINE 48X150 (DRAPES) ×2 IMPLANT
DRAPE SURG 17X11 SM STRL (DRAPES) ×2 IMPLANT
DRSG OPSITE POSTOP 3X4 (GAUZE/BANDAGES/DRESSINGS) ×1 IMPLANT
ELECT CAUTERY BLADE TIP 2.5 (TIP) ×2
ELECT EZSTD 165MM 6.5IN (MISCELLANEOUS)
ELECT REM PT RETURN 9FT ADLT (ELECTROSURGICAL) ×2
ELECTRODE CAUTERY BLDE TIP 2.5 (TIP) ×1 IMPLANT
ELECTRODE EZSTD 165MM 6.5IN (MISCELLANEOUS) IMPLANT
ELECTRODE REM PT RTRN 9FT ADLT (ELECTROSURGICAL) ×1 IMPLANT
GLOVE BIOGEL PI IND STRL 6.5 (GLOVE) ×1 IMPLANT
GLOVE BIOGEL PI IND STRL 8.5 (GLOVE) ×1 IMPLANT
GLOVE BIOGEL PI INDICATOR 6.5 (GLOVE) ×1
GLOVE BIOGEL PI INDICATOR 8.5 (GLOVE) ×1
GLOVE SURG SYN 6.5 ES PF (GLOVE) ×4 IMPLANT
GLOVE SURG SYN 6.5 PF PI (GLOVE) ×2 IMPLANT
GLOVE SURG SYN 8.5  E (GLOVE) ×6
GLOVE SURG SYN 8.5 E (GLOVE) ×3 IMPLANT
GLOVE SURG SYN 8.5 PF PI (GLOVE) ×3 IMPLANT
GOWN SRG LRG LVL 4 IMPRV REINF (GOWNS) ×1 IMPLANT
GOWN SRG XL LVL 3 NONREINFORCE (GOWNS) ×1 IMPLANT
GOWN STRL NON-REIN TWL XL LVL3 (GOWNS) ×2
GOWN STRL REIN LRG LVL4 (GOWNS) ×2
GRADUATE 1200CC STRL 31836 (MISCELLANEOUS) ×2 IMPLANT
GRAFT DURAGEN MATRIX 1WX1L (Tissue) IMPLANT
KIT SPINAL PRONEVIEW (KITS) ×2 IMPLANT
MANIFOLD NEPTUNE II (INSTRUMENTS) ×2 IMPLANT
MARKER SKIN DUAL TIP RULER LAB (MISCELLANEOUS) ×4 IMPLANT
NDL SAFETY ECLIPSE 18X1.5 (NEEDLE) ×1 IMPLANT
NEEDLE HYPO 18GX1.5 SHARP (NEEDLE) ×2
NEEDLE HYPO 22GX1.5 SAFETY (NEEDLE) ×2 IMPLANT
NS IRRIG 1000ML POUR BTL (IV SOLUTION) ×1 IMPLANT
NS IRRIG 500ML POUR BTL (IV SOLUTION) ×1 IMPLANT
PACK LAMINECTOMY NEURO (CUSTOM PROCEDURE TRAY) ×2 IMPLANT
PAD ARMBOARD 7.5X6 YLW CONV (MISCELLANEOUS) ×2 IMPLANT
SOLUTION IRRIG SURGIPHOR (IV SOLUTION) ×1 IMPLANT
SURGIFLO W/THROMBIN 8M KIT (HEMOSTASIS) ×2 IMPLANT
SUT DVC VLOC 3-0 CL 6 P-12 (SUTURE) ×2 IMPLANT
SUT VIC AB 0 CT1 27 (SUTURE) ×2
SUT VIC AB 0 CT1 27XCR 8 STRN (SUTURE) ×1 IMPLANT
SUT VIC AB 2-0 CT1 18 (SUTURE) ×2 IMPLANT
SYR 10ML LL (SYRINGE) ×2 IMPLANT
SYR 20ML LL LF (SYRINGE) ×2 IMPLANT
SYR 30ML LL (SYRINGE) ×4 IMPLANT
SYR 3ML LL SCALE MARK (SYRINGE) ×2 IMPLANT
TOWEL OR 17X26 4PK STRL BLUE (TOWEL DISPOSABLE) ×6 IMPLANT
TUBING CONNECTING 10 (TUBING) ×2 IMPLANT
WATER STERILE IRR 1000ML POUR (IV SOLUTION) ×1 IMPLANT

## 2022-03-17 NOTE — Discharge Instructions (Addendum)
Your surgeon has performed an operation on your lumbar spine (low back) to relieve pressure on one or more nerves. Many times, patients feel better immediately after surgery and can "overdo it." Even if you feel well, it is important that you follow these activity guidelines. If you do not let your back heal properly from the surgery, you can increase the chance of a disc herniation and/or return of your symptoms. The following are instructions to help in your recovery once you have been discharged from the hospital.  * Hold Aspirin per pre-op instructions  Activity    No bending, lifting, or twisting ("BLT"). Avoid lifting objects heavier than 10 pounds (gallon milk jug).  Where possible, avoid household activities that involve lifting, bending, pushing, or pulling such as laundry, vacuuming, grocery shopping, and childcare. Try to arrange for help from friends and family for these activities while your back heals.  Increase physical activity slowly as tolerated.  Taking short walks is encouraged, but avoid strenuous exercise. Do not jog, run, bicycle, lift weights, or participate in any other exercises unless specifically allowed by your doctor. Avoid prolonged sitting, including car rides.  Talk to your doctor before resuming sexual activity.  You should not drive until cleared by your doctor.  Until released by your doctor, you should not return to work or school.  You should rest at home and let your body heal.   You may shower two days after your surgery.  After showering, lightly dab your incision dry. Do not take a tub bath or go swimming for 3 weeks, or until approved by your doctor at your follow-up appointment.  If you smoke, we strongly recommend that you quit.  Smoking has been proven to interfere with normal healing in your back and will dramatically reduce the success rate of your surgery. Please contact [facility] (800-QUIT-NOW) and use the resources at www.[facility].com for  assistance in stopping smoking.  Surgical Incision   If you have a dressing on your incision, you may remove it three days after your surgery. Keep your incision area clean and dry.  If you have staples or stitches on your incision, you should have a follow up scheduled for removal. If you do not have staples or stitches, you will have steri-strips (small pieces of surgical tape) or Dermabond glue. The steri-strips/glue should begin to peel away within about a week (it is fine if the steri-strips fall off before then). If the strips are still in place one week after your surgery, you may gently remove them.  Diet            You may return to your usual diet. Be sure to stay hydrated.  When to Contact us  Although your surgery and recovery will likely be uneventful, you may have some residual numbness, aches, and pains in your back and/or legs. This is normal and should improve in the next few weeks.  However, should you experience any of the following, contact us immediately: New numbness or weakness Pain that is progressively getting worse, and is not relieved by your pain medications or rest Bleeding, redness, swelling, pain, or drainage from surgical incision Chills or flu-like symptoms Fever greater than 101.0 F (38.3 C) Problems with bowel or bladder functions Difficulty breathing or shortness of breath Warmth, tenderness, or swelling in your calf  Contact Information During office hours (Monday-Friday 9 am to 5 pm), please call your physician at [phone] After hours and weekends, please call [phone] and speak with the  answering service, who will contact the doctor on call.  If that fails, call the [place] Operator at [phone] and ask for the Neurosurgery Resident On Call  For a life-threatening emergency, call [place]   The drugs that you were given will stay in your system until tomorrow so for the next 24 hours you should  not:  Drive an automobile Make any legal decisions Drink any alcoholic beverage   You may resume regular meals tomorrow.  Today it is better to start with liquids and gradually work up to solid foods.  You may eat anything you prefer, but it is better to start with liquids, then soup and crackers, and gradually work up to solid foods.   Please notify your doctor immediately if you have any unusual bleeding, trouble breathing, redness and pain at the surgery site, drainage, fever, or pain not relieved by medication.    Additional Instructions:        Please contact your physician with any problems or Same Day Surgery at [phone], Monday through Friday 6 am to 4 pm, or [place] at [facility] number at [phone].

## 2022-03-17 NOTE — Anesthesia Preprocedure Evaluation (Addendum)
Anesthesia Evaluation  Patient identified by MRN, date of birth, ID band Patient awake    Reviewed: Allergy & Precautions, NPO status , Patient's Chart, lab work & pertinent test results  History of Anesthesia Complications Negative for: history of anesthetic complications  Airway Mallampati: III   Neck ROM: Full    Dental  (+) Partial Lower   Pulmonary former smoker (quit approx 7 years ago),    Pulmonary exam normal breath sounds clear to auscultation       Cardiovascular Exercise Tolerance: Good Normal cardiovascular exam Rhythm:Regular Rate:Normal  ECG 03/05/22:  Normal sinus rhythm Indeterminate axis   Neuro/Psych PSYCHIATRIC DISORDERS Anxiety Depression negative neurological ROS     GI/Hepatic negative GI ROS,   Endo/Other  Hypothyroidism   Renal/GU Renal disease (nephrolithiasis)     Musculoskeletal  (+) Arthritis ,   Abdominal   Peds  Hematology negative hematology ROS (+)   Anesthesia Other Findings   Reproductive/Obstetrics Cervical CA                            Anesthesia Physical Anesthesia Plan  ASA: 2  Anesthesia Plan: General   Post-op Pain Management:    Induction: Intravenous  PONV Risk Score and Plan: 3 and Ondansetron, Dexamethasone and Treatment may vary due to age or medical condition  Airway Management Planned: Oral ETT  Additional Equipment:   Intra-op Plan:   Post-operative Plan: Extubation in OR  Informed Consent: I have reviewed the patients History and Physical, chart, labs and discussed the procedure including the risks, benefits and alternatives for the proposed anesthesia with the patient or authorized representative who has indicated his/her understanding and acceptance.     Dental advisory given  Plan Discussed with: CRNA  Anesthesia Plan Comments: (Patient consented for risks of anesthesia including but not limited to:  - adverse  reactions to medications - damage to eyes, teeth, lips or other oral mucosa - nerve damage due to positioning  - sore throat or hoarseness - damage to heart, brain, nerves, lungs, other parts of body or loss of life  Informed patient about role of CRNA in peri- and intra-operative care.  Patient voiced understanding.)        Anesthesia Quick Evaluation

## 2022-03-17 NOTE — Progress Notes (Signed)
Patient ambulated, voided 200 ml. And drank fluids.

## 2022-03-17 NOTE — Anesthesia Procedure Notes (Signed)
Procedure Name: Intubation Date/Time: 03/17/2022 1:15 PM Performed by: Kerri Perches, CRNA Pre-anesthesia Checklist: Patient identified, Patient being monitored, Timeout performed, Emergency Drugs available and Suction available Patient Re-evaluated:Patient Re-evaluated prior to induction Oxygen Delivery Method: Circle system utilized Preoxygenation: Pre-oxygenation with 100% oxygen Induction Type: IV induction Ventilation: Mask ventilation without difficulty and Oral airway inserted - appropriate to patient size Laryngoscope Size: Mac and 3 Grade View: Grade I Tube type: Oral Tube size: 7.0 mm Number of attempts: 1 Airway Equipment and Method: Stylet Placement Confirmation: ETT inserted through vocal cords under direct vision, positive ETCO2 and breath sounds checked- equal and bilateral Secured at: 21 cm Tube secured with: Tape Dental Injury: Teeth and Oropharynx as per pre-operative assessment  Comments: Intubated by Everlene Other SRNA

## 2022-03-17 NOTE — H&P (Signed)
I have reviewed and confirmed my history and physical from 03/16/2022 with no additions or changes. Plan for L L5/S1 far lateral discectomy.  Risks and benefits reviewed.  Heart sounds normal no MRG. Chest Clear to Auscultation Bilaterally.

## 2022-03-17 NOTE — Op Note (Signed)
Indications: The patient is a 66 yo female who presented with lumbar radiculopathy who failed conservative management.  Findings: far lateral disc herniation at L5/S1.  Preoperative Diagnosis: Lumbar radiculopathy Postoperative Diagnosis: same   EBL: 25 ml IVF: 800 ml Drains: none Disposition: Extubated and Stable to PACU Complications: none  No foley catheter was placed.   Preoperative Note:   Risks of surgery discussed include: infection, bleeding, stroke, coma, death, paralysis, CSF leak, nerve/spinal cord injury, numbness, tingling, weakness, complex regional pain syndrome, recurrent stenosis and/or disc herniation, vascular injury, development of instability, neck/back pain, need for further surgery, persistent symptoms, development of deformity, and the risks of anesthesia. The patient understood these risks and agreed to proceed.  Operative Note:   1) Left L5/s1 Far lateral discectomy and foraminotomy  The patient was then brought from the preoperative center with intravenous access established.  The patient underwent general anesthesia and endotracheal tube intubation, and was then rotated on the Glasgow rail top where all pressure points were appropriately padded.  The skin was then thoroughly cleansed.  Perioperative antibiotic prophylaxis was administered.  Sterile prep and drapes were then applied and a timeout was then observed.  C-arm was brought into the field under sterile conditions, and the L5/S1 disc space identified and marked with an incision on the left ~4cm lateral to midline.   Once this was complete a 2 cm incision was opened with the use of a #10 blade knife.  The Metrx tubes were sequentially advanced under lateral fluoroscopy until a 18 x 60 mm Metrx tube was placed over the S1 ala/ transverse process and secured to the bed.    The microscope was then sterilely brought into the field and muscle creep was hemostased with a bipolar and resected with a pituitary  rongeur.  A Bovie extender was then used to expose the transverse process and lateral facet.  Careful attention was placed to not violate the facet capsule. A 3 mm matchstick drill bit was then used to drill off the top of the transverse process, lateral L5/S1 facet, and lateral pars.    Using careful dissected, the superior aspect of the S1 pedicle was identified and exposed down to its junction with the vertebral body.  The disc was identified. The disc was entered using a small Penfield 4. The L5 nerve root was identified and freed using a Penfield 4 and balltip probe.  The disc herniation was identified and dissected free using a balltip probe. The pituitary rongeur was used to remove the extruded disc fragments. Once the nerve root were noted to be relaxed and under less tension the ball-tipped feeler was passed along the foramen proximally to to ensure no residual compression was noted.    Depo-Medrol was placed along the nerve root.  The area was irrigated. The tube system was then removed under microscopic visualization and hemostasis was obtained with a bipolar.    The fascial layer was reapproximated with the use of a 0- Vicryl suture.  Subcutaneous tissue layer was reapproximated using 2-0 Vicryl suture.  3-0 monocryl was used on the skin. The skin was then cleansed and Dermabond was used to close the skin opening.  Patient was then rotated back to the preoperative bed awakened from anesthesia and taken to recovery all counts are correct in this case.   I performed the entire procedure with the assistance of Cooper Render PA as an Pensions consultant.  Meade Maw MD

## 2022-03-17 NOTE — Transfer of Care (Addendum)
Immediate Anesthesia Transfer of Care Note  Patient: Amanda Gonzales  Procedure(s) Performed: LEFT L5-S1 FAR LATERAL DISCECTOMY (Left: Back)  Patient Location: PACU  Anesthesia Type:General  Level of Consciousness: awake  Airway & Oxygen Therapy: Patient Spontanous Breathing  Post-op Assessment: Report given to RN  Post vital signs: stable  Last Vitals:  Vitals Value Taken Time  BP    Temp    Pulse    Resp    SpO2      Last Pain:  Vitals:   03/17/22 1211  TempSrc: Oral  PainSc: 3          Complications: No notable events documented.

## 2022-03-17 NOTE — Discharge Summary (Signed)
Physician Discharge Summary  Patient ID: Amanda Gonzales MRN: [id number] DOB/AGE: 66-Nov-1957 66 y.o.  Admit date: 03/17/2022 Discharge date: 03/17/2022  Admission Diagnoses: Lumbar radiculopathy   Discharge Diagnoses:  Active Problems:   * No active hospital problems. *   Discharged Condition: good  Hospital Course:  Amanda Gonzales is a 66 y.o s/p Left L5-S1 Far lateral discectomy and foraminotomy. Her intraoperative course was uncomplicated. She was monitored post-op in PACU and discharged home after ambulating, urinating, and tolerating PO intake. She was given prescriptions for pain medications and stool softener.  Consults: None  Significant Diagnostic Studies: none  Treatments: surgery: as above. Please see separately dictated operative report for further details  Discharge Exam: Blood pressure (!) 135/91, pulse 88, temperature 98.2 F (36.8 C), temperature source Oral, resp. rate 18, height '5\' 2"'$  (1.575 m), weight 64.4 kg, SpO2 98 %. CN II-XII grossly intact 5/5 throughout BLE Incision c/d/I and covered with clean post-op bandage  Disposition: Discharge disposition: 01-Home or Self Care        Allergies as of 03/17/2022   No Known Allergies      Medication List     STOP taking these medications    aspirin 81 MG chewable tablet   cyclobenzaprine 10 MG tablet Commonly known as: FLEXERIL   HYDROcodone-acetaminophen 5-325 MG tablet Commonly known as: NORCO/VICODIN   temazepam 7.5 MG capsule Commonly known as: RESTORIL       TAKE these medications    acetaminophen 650 MG CR tablet Commonly known as: TYLENOL Take 1,300 mg by mouth every 8 (eight) hours as needed for pain.   clonazePAM 1 MG tablet Commonly known as: KLONOPIN Take 1 tablet (1 mg total) by mouth 2 (two) times daily as needed.   DULoxetine 60 MG capsule Commonly known as: CYMBALTA Take 1 capsule (60 mg total) by mouth daily.   estrogens-methylTEST 1.25-2.5 MG tablet Commonly  known as: ESTRATEST Take 1 tablet by mouth once daily   gabapentin 100 MG capsule Commonly known as: NEURONTIN Take 100 mg by mouth at bedtime.   gabapentin 300 MG capsule Commonly known as: NEURONTIN Take 300 mg by mouth 3 (three) times daily.   levothyroxine 75 MCG tablet Commonly known as: SYNTHROID Take 1 tablet (75 mcg total) by mouth daily before breakfast.   medroxyPROGESTERone 2.5 MG tablet Commonly known as: PROVERA Take 1 tablet (2.5 mg total) by mouth daily.   oxyCODONE-acetaminophen 5-325 MG tablet Commonly known as: Percocet Take 1 tablet by mouth every 4 (four) hours as needed for up to 5 days for severe pain.   senna 8.6 MG Tabs tablet Commonly known as: SENOKOT Take 1 tablet (8.6 mg total) by mouth daily as needed for mild constipation.   tiZANidine 2 MG tablet Commonly known as: ZANAFLEX Take 2 mg by mouth 3 (three) times daily as needed for muscle spasms.        Follow-up Information     Loleta Dicker, PA Follow up in 2 week(s).   Why: for post-op and incision check. This appointment date and time is on your pre-op paperwork Contact information: [place] [address] [phone]                 Signed: Loleta Dicker 03/17/2022, 2:30 PM

## 2022-03-17 NOTE — Progress Notes (Signed)
PHARMACY -  BRIEF ANTIBIOTIC NOTE   Pharmacy has received consult(s) for Cefazolin from an OR provider.  The patient's profile has been reviewed for ht/wt/allergies/indication/available labs.    One time order(s) placed for Cefazolin 2 gm per pt wt: 66.7 kg from 02/18/22  Further antibiotics/pharmacy consults should be ordered by admitting physician if indicated.                       Thank you, Renda Rolls, PharmD, [facility] 03/17/2022 1:18 AM

## 2022-03-18 NOTE — Anesthesia Postprocedure Evaluation (Signed)
Anesthesia Post Note  Patient: Amanda Gonzales  Procedure(s) Performed: LEFT L5-S1 FAR LATERAL DISCECTOMY (Left: Back)  Patient location during evaluation: PACU Anesthesia Type: General Level of consciousness: awake and alert, oriented and patient cooperative Pain management: pain level controlled Vital Signs Assessment: post-procedure vital signs reviewed and stable Respiratory status: spontaneous breathing, nonlabored ventilation and respiratory function stable Cardiovascular status: blood pressure returned to baseline and stable Postop Assessment: adequate PO intake Anesthetic complications: no   No notable events documented.   Last Vitals:  Vitals:   03/17/22 1557 03/17/22 1605  BP:  133/89  Pulse: 95 99  Resp: (!) 21 16  Temp:  (!) 36.4 C  SpO2: 97% 97%    Last Pain:  Vitals:   03/17/22 1605  TempSrc: Temporal  PainSc: [place]

## 2022-04-29 ENCOUNTER — Encounter: Payer: Self-pay | Admitting: Neurosurgery

## 2022-06-01 ENCOUNTER — Encounter: Payer: Self-pay | Admitting: Neurosurgery

## 2022-06-01 ENCOUNTER — Ambulatory Visit (INDEPENDENT_AMBULATORY_CARE_PROVIDER_SITE_OTHER): Payer: Medicare Other | Admitting: Neurosurgery

## 2022-06-01 VITALS — BP 160/95 | HR 83 | Ht 62.0 in | Wt 140.6 lb

## 2022-06-01 DIAGNOSIS — Z09 Encounter for follow-up examination after completed treatment for conditions other than malignant neoplasm: Secondary | ICD-10-CM

## 2022-06-01 DIAGNOSIS — M5416 Radiculopathy, lumbar region: Secondary | ICD-10-CM

## 2022-06-01 NOTE — Progress Notes (Signed)
   DOS: 03/17/22 L L5/S1 far lateral discectomy  HISTORY OF PRESENT ILLNESS: 06/01/2022 [name] is status post bilateral discectomy.  Her back is still bothering her, but her leg pain is completely gone.Marland Kitchen   PHYSICAL EXAMINATION:   Vitals:   06/01/22 1618  BP: (!) 160/95  Pulse: 83   General: Patient is well developed, well nourished, calm, collected, and in no apparent distress.  NEUROLOGICAL:  General: In no acute distress.  Awake, alert, oriented to person, place, and time. Pupils equal round and reactive to light.   Strength:  Side Iliopsoas Quads Hamstring PF DF EHL  R '5 5 5 5 5 5  '$ L '5 5 5 5 5 5   '$ Incision c/d/i   ROS (Neurologic): Negative except as noted above  IMAGING: No interval imaging to review   ASSESSMENT/PLAN:  Amanda Gonzales is doing well after her lateral discectomy and foraminotomies.  I am pleased with her improvements.  We reviewed her activity limitations.  Her back is uncomfortable from her compression fracture, but that may ease over time.  I will see her back on an as-needed basis.  I spent a total of 10 minutes in face-to-face and non-face-to-face activities related to this patient's care today.   Meade Maw MD, [facility] Department of Neurosurgery

## 2022-06-07 ENCOUNTER — Telehealth: Payer: Self-pay

## 2022-06-07 MED ORDER — GABAPENTIN 300 MG PO CAPS
300.0000 mg | ORAL_CAPSULE | Freq: Every day | ORAL | 1 refills | Status: AC
Start: 2022-06-07 — End: ?

## 2022-06-07 NOTE — Telephone Encounter (Signed)
I sent in a 30 day supply with 1 refill. This should get her to her next appointment with Dr Tressia Miners. If she is still needing it at that time, she should ask Dr Tressia Miners to take over prescribing it, as Dr Izora Ribas told her to follow up with [place] as needed. Thanks

## 2022-06-07 NOTE — Telephone Encounter (Signed)
-----   Message from Peggyann Shoals sent at 06/07/2022  3:34 PM EDT ----- Regarding: med refill Contact: [phone]  left L5-S1 far lateral discectomy on 03/17/2022 Gabapentin '300mg'$  once daily [place]

## 2022-06-07 NOTE — Telephone Encounter (Signed)
Patient is aware is that medication was sent in but any further refills will need to come from her PCP, she agreed.

## 2022-12-17 ENCOUNTER — Encounter: Payer: Self-pay | Admitting: Unknown Physician Specialty

## 2022-12-17 ENCOUNTER — Other Ambulatory Visit: Payer: Self-pay | Admitting: Unknown Physician Specialty

## 2022-12-17 DIAGNOSIS — D485 Neoplasm of uncertain behavior of skin: Secondary | ICD-10-CM

## 2023-01-18 ENCOUNTER — Other Ambulatory Visit: Payer: Self-pay | Admitting: Internal Medicine

## 2023-01-18 DIAGNOSIS — R079 Chest pain, unspecified: Secondary | ICD-10-CM

## 2023-01-18 DIAGNOSIS — I119 Hypertensive heart disease without heart failure: Secondary | ICD-10-CM

## 2023-01-20 IMAGING — XA DG LUMBAR SPINE 2-3V
1 series · 5 of 5 positions shown · non-contrast
Comparison: MRI lumbar spine 12/25/2021

CLINICAL DATA: L5-S1 lateral discectomy.

EXAM:
LUMBAR SPINE - 2-3 VIEW; DG C-ARM 1-60 MIN-NO REPORT

[Series 1: unknown protocol · 0.20mm/px · 5 of 5 slices shown]
[im 1/5]
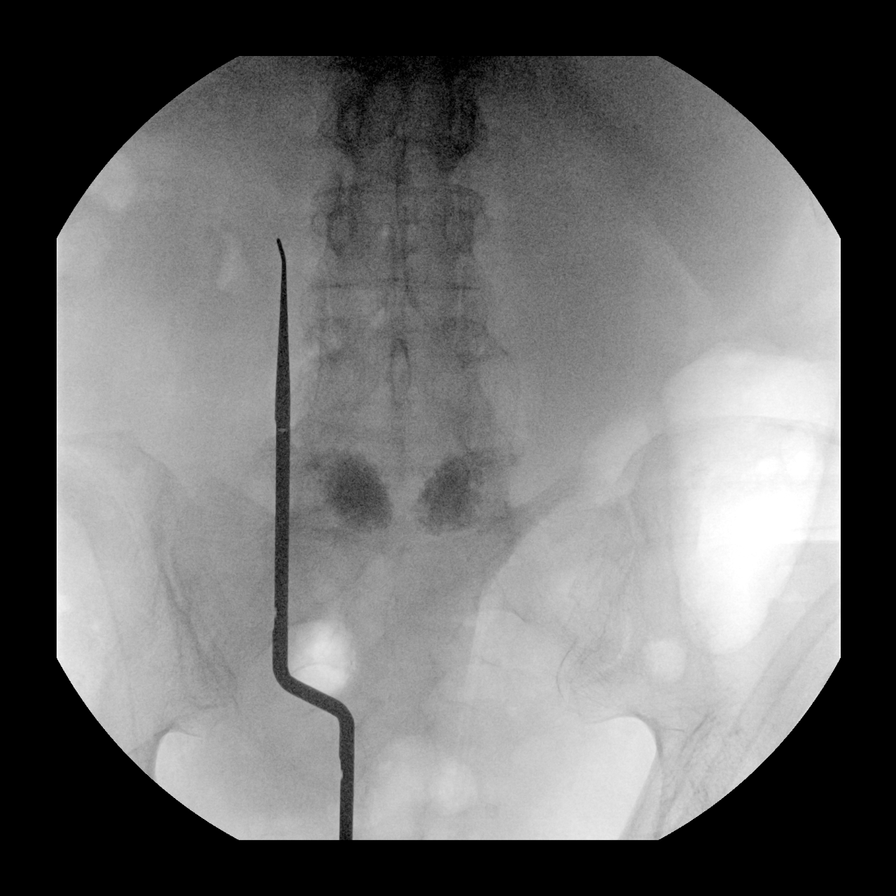
[im 2/5]
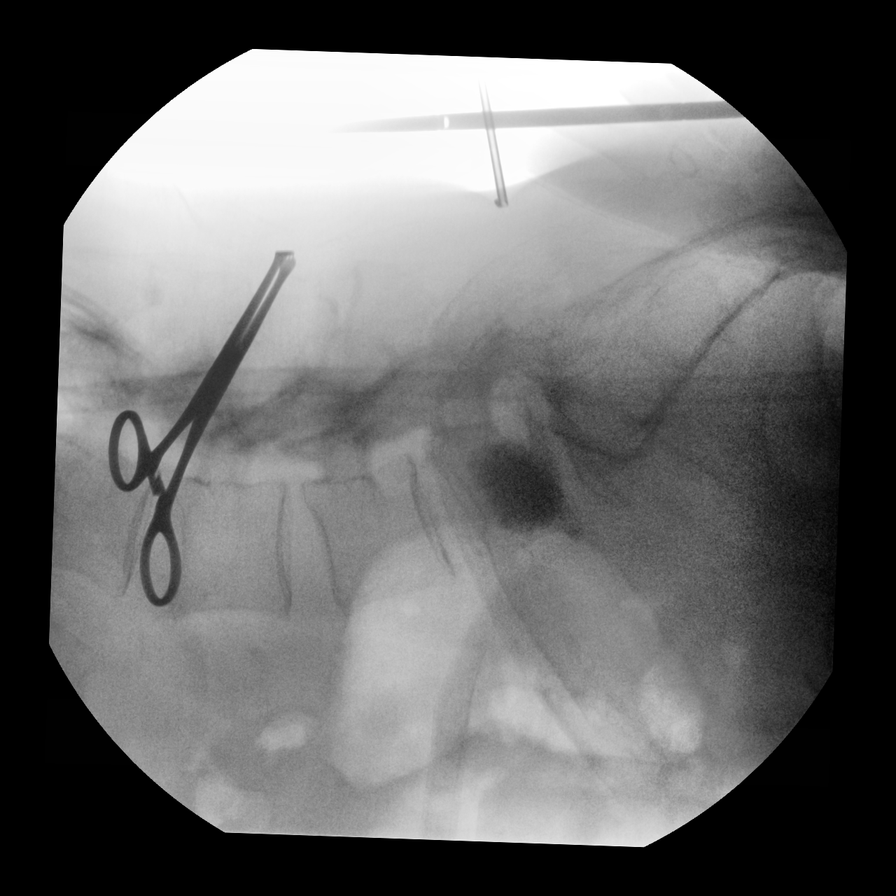
[im 3/5]
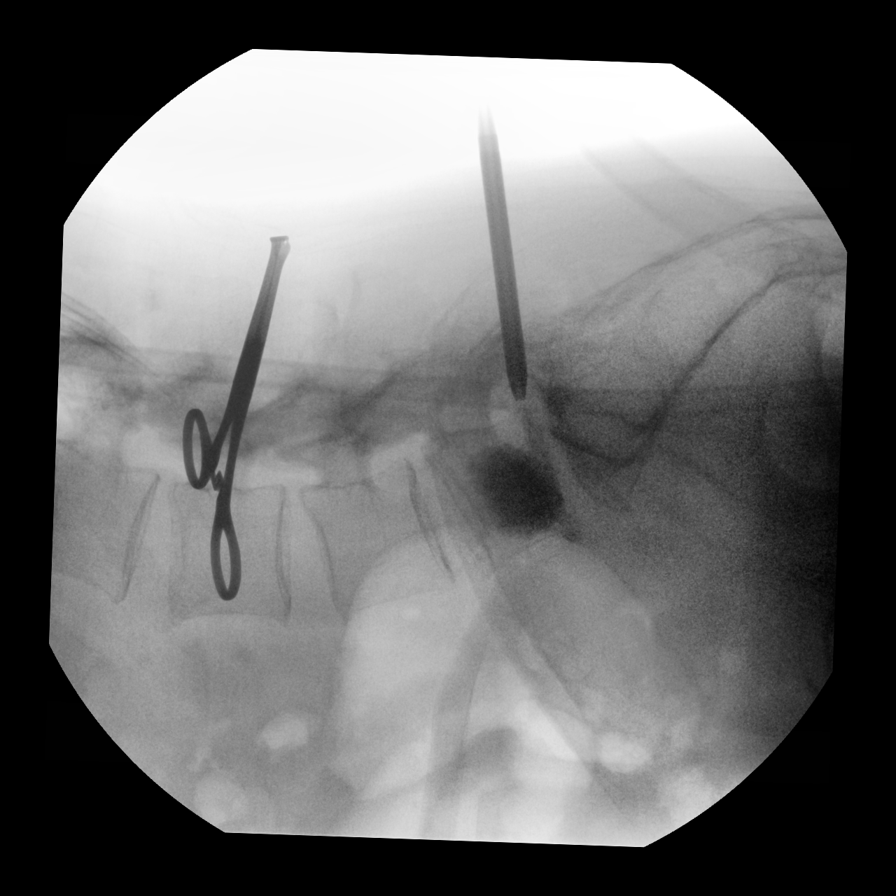
[im 4/5]
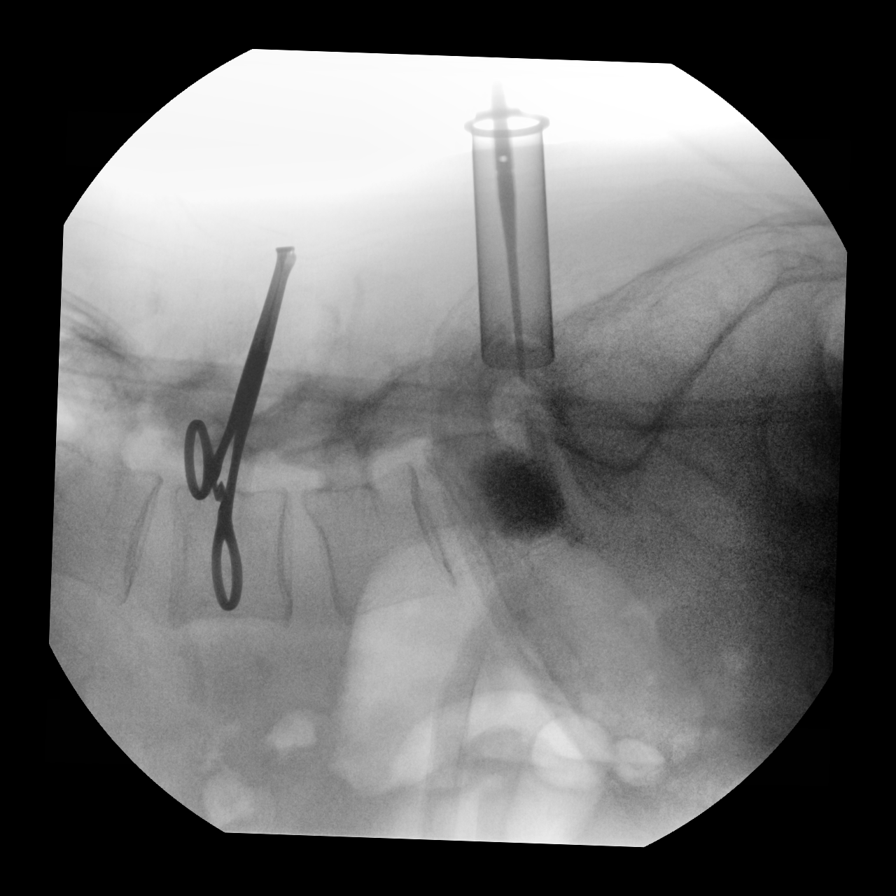
[im 5/5]
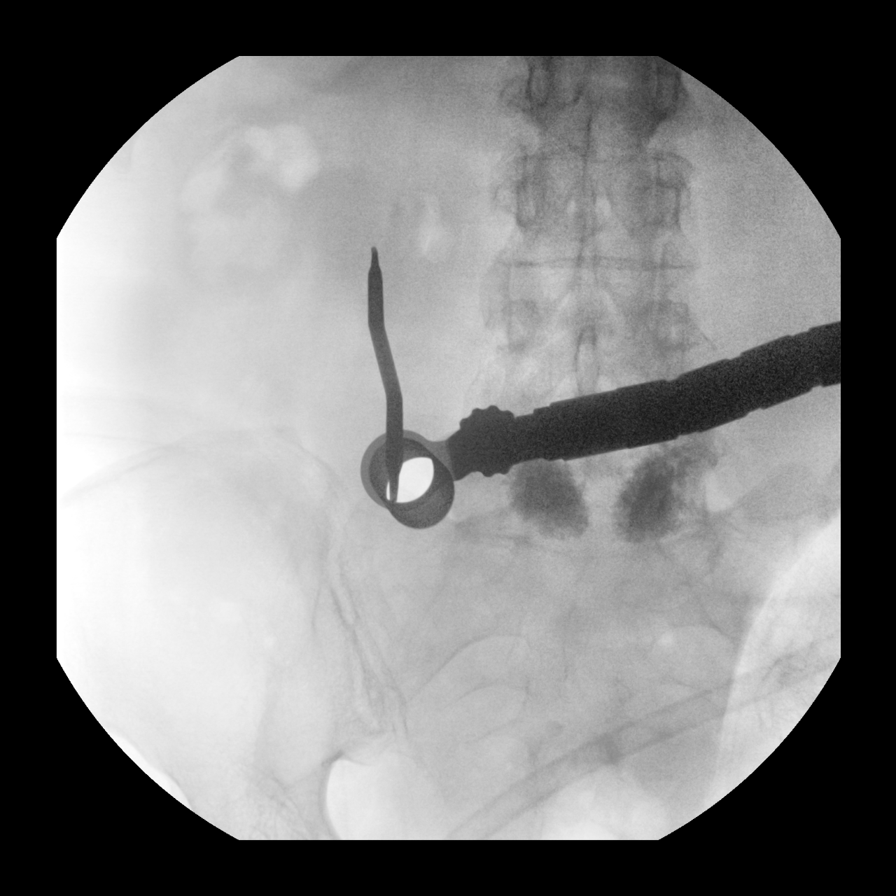

[5 of 5 positions shown; findings below may reference images not displayed]

FINDINGS: Lumbar localization performed the operating room. Surgical
instrument overlying the spinal canal at the L5-S1 level on the
lateral view.

Pre-existing cement vertebroplasty at L5 bilaterally.
IMPRESSION: Surgical localization L5-S1 disc space.

## 2023-01-26 ENCOUNTER — Ambulatory Visit
Admission: RE | Admit: 2023-01-26 | Discharge: 2023-01-26 | Disposition: A | Discharge status: Home / Self Care | Payer: Medicare Other | Source: Ambulatory Visit | Attending: Internal Medicine | Admitting: Internal Medicine

## 2023-01-26 DIAGNOSIS — R079 Chest pain, unspecified: Secondary | ICD-10-CM | POA: Insufficient documentation

## 2023-01-26 DIAGNOSIS — I119 Hypertensive heart disease without heart failure: Secondary | ICD-10-CM

## 2023-02-02 ENCOUNTER — Other Ambulatory Visit: Payer: Medicare Other

## 2023-02-03 ENCOUNTER — Other Ambulatory Visit: Payer: Medicare Other

## 2023-02-05 ENCOUNTER — Other Ambulatory Visit: Payer: Medicare Other

## 2023-02-08 ENCOUNTER — Other Ambulatory Visit: Payer: Medicare Other

## 2023-07-07 ENCOUNTER — Other Ambulatory Visit: Payer: Self-pay | Admitting: Internal Medicine

## 2023-07-07 ENCOUNTER — Ambulatory Visit
Admission: RE | Admit: 2023-07-07 | Discharge: 2023-07-07 | Disposition: A | Discharge status: Home / Self Care | Payer: Medicare Other | Source: Ambulatory Visit | Attending: Internal Medicine | Admitting: Internal Medicine

## 2023-07-07 DIAGNOSIS — R1011 Right upper quadrant pain: Secondary | ICD-10-CM | POA: Insufficient documentation

## 2023-07-07 MED ORDER — IOHEXOL 300 MG/ML  SOLN
100.0000 mL | Freq: Once | INTRAMUSCULAR | Status: AC | PRN
  Administered 2023-07-07: 100 mL via INTRAVENOUS

## 2023-09-23 ENCOUNTER — Other Ambulatory Visit: Payer: Self-pay | Admitting: Plastic Surgery

## 2023-09-23 DIAGNOSIS — Z1231 Encounter for screening mammogram for malignant neoplasm of breast: Secondary | ICD-10-CM

## 2023-09-26 ENCOUNTER — Ambulatory Visit: Payer: Medicare Other | Admitting: Dermatology

## 2023-10-08 ENCOUNTER — Other Ambulatory Visit: Payer: Self-pay

## 2023-10-08 DIAGNOSIS — E039 Hypothyroidism, unspecified: Secondary | ICD-10-CM | POA: Diagnosis not present

## 2023-10-08 DIAGNOSIS — I6789 Other cerebrovascular disease: Secondary | ICD-10-CM | POA: Diagnosis not present

## 2023-10-08 DIAGNOSIS — Z7982 Long term (current) use of aspirin: Secondary | ICD-10-CM | POA: Diagnosis not present

## 2023-10-08 DIAGNOSIS — Y908 Blood alcohol level of 240 mg/100 ml or more: Secondary | ICD-10-CM | POA: Diagnosis not present

## 2023-10-08 DIAGNOSIS — Z79899 Other long term (current) drug therapy: Secondary | ICD-10-CM | POA: Insufficient documentation

## 2023-10-08 DIAGNOSIS — Z8541 Personal history of malignant neoplasm of cervix uteri: Secondary | ICD-10-CM | POA: Diagnosis not present

## 2023-10-08 DIAGNOSIS — F10129 Alcohol abuse with intoxication, unspecified: Secondary | ICD-10-CM | POA: Diagnosis present

## 2023-10-08 NOTE — Progress Notes (Signed)
Search warrant for blood reviewed. Blood draw authorized.

## 2023-10-08 NOTE — ED Notes (Signed)
Pt refusing temperature assessment.

## 2023-10-08 NOTE — ED Triage Notes (Signed)
Pt to ed from the side of the road. EMS was called out for ETOH intoxication. Pt is not in police custody but police are at the bedside for blood draw, waiting on warrant for same. Pt is alert but sleepy.   80 HR BGL 139 150/86

## 2023-10-09 ENCOUNTER — Emergency Department: Payer: Medicare Other

## 2023-10-09 ENCOUNTER — Emergency Department
Admission: EM | Admit: 2023-10-09 | Discharge: 2023-10-09 | Disposition: A | Discharge status: Home / Self Care | Payer: No Typology Code available for payment source | Attending: Emergency Medicine | Admitting: Emergency Medicine

## 2023-10-09 DIAGNOSIS — F10920 Alcohol use, unspecified with intoxication, uncomplicated: Secondary | ICD-10-CM

## 2023-10-09 LAB — CBC
HCT: 47.7 % — ABNORMAL HIGH (ref 36.0–46.0)
Hemoglobin: 16.7 g/dL — ABNORMAL HIGH (ref 12.0–15.0)
MCH: 31.7 pg (ref 26.0–34.0)
MCHC: 35 g/dL (ref 30.0–36.0)
MCV: 90.7 fL (ref 80.0–100.0)
Platelets: 346 10*3/uL (ref 150–400)
RBC: 5.26 MIL/uL — ABNORMAL HIGH (ref 3.87–5.11)
RDW: 13 % (ref 11.5–15.5)
WBC: 8.2 10*3/uL (ref 4.0–10.5)
nRBC: 0 % (ref 0.0–0.2)

## 2023-10-09 LAB — COMPREHENSIVE METABOLIC PANEL
ALT: 22 U/L (ref 0–44)
AST: 21 U/L (ref 15–41)
Albumin: 4.5 g/dL (ref 3.5–5.0)
Alkaline Phosphatase: 70 U/L (ref 38–126)
Anion gap: 9 (ref 5–15)
BUN: 15 mg/dL (ref 8–23)
CO2: 29 mmol/L (ref 22–32)
Calcium: 9.1 mg/dL (ref 8.9–10.3)
Chloride: 100 mmol/L (ref 98–111)
Creatinine, Ser: 0.77 mg/dL (ref 0.44–1.00)
GFR, Estimated: >60 mL/min (ref >60)
Glucose, Bld: 128 mg/dL — ABNORMAL HIGH (ref 70–99)
Potassium: 3.8 mmol/L (ref 3.5–5.1)
Sodium: 138 mmol/L (ref 135–145)
Total Bilirubin: 0.7 mg/dL (ref <1.2)
Total Protein: 7.5 g/dL (ref 6.5–8.1)

## 2023-10-09 LAB — URINALYSIS, ROUTINE W REFLEX MICROSCOPIC
Bilirubin Urine: NEGATIVE
Glucose, UA: NEGATIVE mg/dL
Hgb urine dipstick: NEGATIVE
Ketones, ur: NEGATIVE mg/dL
Leukocytes,Ua: NEGATIVE
Nitrite: NEGATIVE
Protein, ur: NEGATIVE mg/dL
Specific Gravity, Urine: 1.002 — ABNORMAL LOW (ref 1.005–1.030)
pH: 6 (ref 5.0–8.0)

## 2023-10-09 LAB — URINE DRUG SCREEN, QUALITATIVE (ARMC ONLY)
Amphetamines, Ur Screen: NOT DETECTED
Barbiturates, Ur Screen: NOT DETECTED
Benzodiazepine, Ur Scrn: NOT DETECTED
Cannabinoid 50 Ng, Ur ~~LOC~~: NOT DETECTED
Cocaine Metabolite,Ur ~~LOC~~: NOT DETECTED
MDMA (Ecstasy)Ur Screen: NOT DETECTED
Methadone Scn, Ur: NOT DETECTED
Opiate, Ur Screen: NOT DETECTED
Phencyclidine (PCP) Ur S: NOT DETECTED
Tricyclic, Ur Screen: NOT DETECTED

## 2023-10-09 LAB — ETHANOL: Alcohol, Ethyl (B): 270 mg/dL — ABNORMAL HIGH (ref <10)

## 2023-10-09 NOTE — ED Notes (Signed)
Pt walked to bathroom to change, ambulates w/ steady gait.

## 2023-10-09 NOTE — ED Notes (Addendum)
Pt arrives w/ PD at bedside, not in custody. PIV obtained for blood work. Ambulated w/ pt to obtain urine sample, pt has BM - unable to collect urine. Pt unable to recall events, ETOH intox suspected.

## 2023-10-09 NOTE — ED Provider Notes (Signed)
[facility] Provider Note    None    (approximate)   History   Alcohol Intoxication and Medical Clearance   HPI  Amanda Gonzales is a 67 y.o. female with history of hypothyroidism, anxiety who presents to the emergency department with police for medical clearance.  Police reported concerned citizen called 911 because patient was driving erratically.  EMS reports patient appeared intoxicated and smelled strongly of alcohol.  She was arrested for DUI and brought here due to her "level of intoxication".  Patient refusing to answer any questions and just stating "yeah" repeatedly.   History provided by police.    Past Medical History:  Diagnosis Date   Abnormal Pap smear of cervix 1984   Anxiety    Arthritis    Cancer (HCC)    cervical   Compression fracture of L5 vertebra (HCC)    Depression    Family history of breast cancer in female    5/22 cancer genetic testing letter sent   History of kidney stones    Hypothyroidism    Long term current use of aspirin    Pneumonia    Psoriasis     Past Surgical History:  Procedure Laterality Date   APPENDECTOMY  1976   BACK SURGERY     BREAST SURGERY     breast implants   COLONOSCOPY     DILATION AND CURETTAGE OF UTERUS  1980   MAB   EYE SURGERY  2021   Cataract   facial surgery due to over bite   1980   IR KYPHO LUMBAR INC FX REDUCE BONE BX UNI/BIL CANNULATION INC/IMAGING  11/20/2021   IR RADIOLOGIST EVAL & MGMT  11/12/2021   IR RADIOLOGIST EVAL & MGMT  12/03/2021   WISDOM TOOTH EXTRACTION     WRIST FRACTURE SURGERY Left 2017    MEDICATIONS:  Prior to Admission medications   Medication Sig Start Date End Date Taking? Authorizing Provider  clonazePAM (KLONOPIN) 1 MG tablet Take 1 tablet (1 mg total) by mouth 2 (two) times daily as needed. Patient not taking: Reported on 03/05/2022 02/13/21   Tresea Mall, CNM  DULoxetine (CYMBALTA) 60 MG capsule Take 1 capsule (60 mg total) by mouth daily.  02/13/21   Tresea Mall, CNM  estrogens-methylTEST (ESTRATEST) 1.25-2.5 MG tablet Take 1 tablet by mouth once daily 01/04/22   Tresea Mall, CNM  gabapentin (NEURONTIN) 300 MG capsule Take 1 capsule (300 mg total) by mouth daily. 06/07/22   Venetia Night, MD  levothyroxine (SYNTHROID) 75 MCG tablet Take 1 tablet (75 mcg total) by mouth daily before breakfast. 02/13/21   Tresea Mall, CNM  medroxyPROGESTERone (PROVERA) 2.5 MG tablet Take 1 tablet (2.5 mg total) by mouth daily. 02/13/21   Tresea Mall, CNM    Physical Exam   Triage Vital Signs: ED Triage Vitals  Encounter Vitals Group     BP 10/08/23 2248 114/75     Systolic BP Percentile --      Diastolic BP Percentile --      Pulse Rate 10/08/23 2248 77     Resp 10/08/23 2248 16     Temp --      Temp src --      SpO2 10/08/23 2248 95 %     Weight --      Height --      Head Circumference --      Peak Flow --      Pain Score 10/08/23 2244 0  Pain Loc --      Pain Education --      Exclude from Growth Chart --     Most recent vital signs: Vitals:   10/09/23 0248 10/09/23 0342  BP: (!) 104/55   Pulse: 78   Resp: 20   Temp:  98.1 F (36.7 C)  SpO2: 98%     CONSTITUTIONAL: Alert, arouses to voice and will answer "yeah" to most questions. HEAD: Normocephalic, atraumatic EYES: Conjunctivae clear, pupils appear equal, sclera nonicteric ENT: normal nose; moist mucous membranes NECK: Supple, normal ROM no midline spinal tenderness or step-off or deformity CARD: RRR; S1 and S2 appreciated RESP: Normal chest excursion without splinting or tachypnea; breath sounds clear and equal bilaterally; no wheezes, no rhonchi, no rales, no hypoxia or respiratory distress, speaking full sentences ABD/GI: Non-distended; soft, non-tender, no rebound, no guarding, no peritoneal signs BACK: The back appears normal no midline spinal tenderness or step-off or deformity EXT: Normal ROM in all joints; no deformity noted, no edema SKIN:  Normal color for age and race; warm; no rash on exposed skin NEURO: Moves all extremities equally, normal speech PSYCH: The patient's mood and manner are appropriate.   ED Results / Procedures / Treatments   LABS: (all labs ordered are listed, but only abnormal results are displayed) Labs Reviewed  CBC - Abnormal; Notable for the following components:      Result Value   RBC 5.26 (*)    Hemoglobin 16.7 (*)    HCT 47.7 (*)    All other components within normal limits  COMPREHENSIVE METABOLIC PANEL - Abnormal; Notable for the following components:   Glucose, Bld 128 (*)    All other components within normal limits  ETHANOL - Abnormal; Notable for the following components:   Alcohol, Ethyl (B) 270 (*)    All other components within normal limits  URINALYSIS, ROUTINE W REFLEX MICROSCOPIC - Abnormal; Notable for the following components:   Color, Urine STRAW (*)    APPearance CLEAR (*)    Specific Gravity, Urine 1.002 (*)    All other components within normal limits  URINE DRUG SCREEN, QUALITATIVE ([facility] ONLY)  CBG MONITORING, ED     EKG:  EKG Interpretation Date/Time:    Ventricular Rate:    PR Interval:    QRS Duration:    QT Interval:    QTC Calculation:   R Axis:      Text Interpretation:           RADIOLOGY: My personal review and interpretation of imaging: CT head unremarkable.  I have personally reviewed all radiology reports.   CT HEAD WO CONTRAST ( ) Result Date: 10/09/2023 CLINICAL DATA:  Mental status change, unknown cause EXAM: CT HEAD WITHOUT CONTRAST TECHNIQUE: Contiguous axial images were obtained from the base of the skull through the vertex without intravenous contrast. RADIATION DOSE REDUCTION: This exam was performed according to the departmental dose-optimization program which includes automated exposure control, adjustment of the mA and/or kV according to patient size and/or use of iterative reconstruction technique. COMPARISON:  09/28/2021  FINDINGS: Brain: There is atrophy and chronic small vessel disease changes. No acute intracranial abnormality. Specifically, no hemorrhage, hydrocephalus, mass lesion, acute infarction, or significant intracranial injury. Vascular: No hyperdense vessel or unexpected calcification. Skull: No acute calvarial abnormality. Sinuses/Orbits: No acute findings Other: None IMPRESSION: Atrophy, chronic microvascular disease. No acute intracranial abnormality. Electronically Signed   By: Charlett Nose M.D.   On: 10/09/2023 01:00     PROCEDURES:  Critical  Care performed: No     Procedures    IMPRESSION / MDM / ASSESSMENT AND PLAN / ED COURSE  I reviewed the triage vital signs and the nursing notes.    Patient here for medical clearance.  She is altered, possibly intoxicated.     DIFFERENTIAL DIAGNOSIS (includes but not limited to):   Alcohol intoxication, illicit drug use, intracranial hemorrhage, stroke, hepatic encephalopathy, metabolic abnormality   Patient's presentation is most consistent with acute presentation with potential threat to life or bodily function.   PLAN: Will obtain labs, urine, CT head.  Patient will need to be monitored until mental status has improved.   MEDICATIONS GIVEN IN ED: Medications - No data to display   ED COURSE: CT head reviewed and interpreted by myself and the radiologist and shows no acute abnormality.  Normal blood glucose, hemoglobin, electrolytes.  Alcohol level of 270.  Will be monitored until clinically sober.  6:34 AM  Patient ambulatory, tolerating p.o.  Will discharge with sober ride.   At this time, I do not feel there is any life-threatening condition present. I reviewed all nursing notes, vitals, pertinent previous records.  All lab and urine results, EKGs, imaging ordered have been independently reviewed and interpreted by myself.  I reviewed all available radiology reports from any imaging ordered this visit.  Based on my assessment, I  feel the patient is safe to be discharged home without further emergent workup and can continue workup as an outpatient as needed. Discussed all findings, treatment plan as well as usual and customary return precautions.  They verbalize understanding and are comfortable with this plan.  Outpatient follow-up has been provided as needed.  All questions have been answered.  CONSULTS:  none   OUTSIDE RECORDS REVIEWED: Reviewed last PCP note on 09/20/2023.       FINAL CLINICAL IMPRESSION(S) / ED DIAGNOSES   Final diagnoses:  Alcoholic intoxication without complication (HCC)     Rx / DC Orders   ED Discharge Orders     None        Note:  This document was prepared using Dragon voice recognition software and may include unintentional dictation errors.   Thamas Appleyard, Layla Maw, DO 10/09/23 [phone]

## 2023-10-09 NOTE — ED Notes (Signed)
Pt ambulates to BR w/ NT for urine sample collection. Pt returns to stretcher after giving sample w/o incident. Pending sobriety.

## 2023-10-09 NOTE — ED Notes (Addendum)
Pt given crackers and water, encouraged to eat/drink.

## 2023-10-09 NOTE — ED Notes (Signed)
D/c instructions reviewed w/ patient, per provider pt is OK for d/c once has sober ride home, pt contacting family at this time. Strict no driving precautions given.

## 2023-10-13 ENCOUNTER — Institutional Professional Consult (permissible substitution): Payer: Medicare Other | Admitting: Plastic Surgery

## 2023-10-25 ENCOUNTER — Ambulatory Visit: Payer: Medicare Other

## 2023-11-01 ENCOUNTER — Ambulatory Visit: Payer: Medicare Other

## 2023-11-16 ENCOUNTER — Ambulatory Visit
Admission: RE | Admit: 2023-11-16 | Discharge: 2023-11-16 | Disposition: A | Discharge status: Home / Self Care | Payer: Medicare Other | Source: Ambulatory Visit | Attending: Plastic Surgery | Admitting: Plastic Surgery

## 2023-11-16 ENCOUNTER — Other Ambulatory Visit: Payer: Self-pay | Admitting: Gastroenterology

## 2023-11-16 DIAGNOSIS — R1013 Epigastric pain: Secondary | ICD-10-CM

## 2023-11-16 DIAGNOSIS — R748 Abnormal levels of other serum enzymes: Secondary | ICD-10-CM

## 2023-11-16 DIAGNOSIS — Z1231 Encounter for screening mammogram for malignant neoplasm of breast: Secondary | ICD-10-CM

## 2023-11-19 ENCOUNTER — Other Ambulatory Visit: Payer: Self-pay | Admitting: Gastroenterology

## 2023-11-19 ENCOUNTER — Ambulatory Visit
Admission: RE | Admit: 2023-11-19 | Discharge: 2023-11-19 | Disposition: A | Discharge status: Home / Self Care | Payer: Medicare Other | Source: Ambulatory Visit | Attending: Gastroenterology | Admitting: Gastroenterology

## 2023-11-19 DIAGNOSIS — R748 Abnormal levels of other serum enzymes: Secondary | ICD-10-CM

## 2023-11-19 DIAGNOSIS — R1013 Epigastric pain: Secondary | ICD-10-CM | POA: Insufficient documentation

## 2023-11-19 MED ORDER — GADOBUTROL 1 MMOL/ML IV SOLN
6.0000 mL | Freq: Once | INTRAVENOUS | Status: AC | PRN
  Administered 2023-11-19: 6 mL via INTRAVENOUS

## 2023-12-07 ENCOUNTER — Ambulatory Visit: Payer: Self-pay | Admitting: General Surgery

## 2023-12-07 NOTE — H&P (Signed)
History of Present Illness Patient says she has been having upper abdominal pain since August 2024.  She endorses that the pain is mainly in epigastric area and right upper quadrant.  Sometimes feel nauseated but denies vomiting.  Patient has tried Protonix for pain without improvement.  No alleviating or aggravating factors.  She was evaluated by gastroenterology and she was found with elevated lipase.  She had an MRCP that shows a large.  No common bile duct dilation.  No sign of choledocholithiasis.  I personally evaluated the images.  GI recommended cholecystectomy.      PAST MEDICAL HISTORY:  Past Medical History:  Diagnosis Date   Anxiety    Depression    Hypothyroidism    Kidney stone    Thyroid disease         PAST SURGICAL HISTORY:   Past Surgical History:  Procedure Laterality Date   KYPHOPLASTY  11/20/2021   L5   Left L5-S1 far lateral discectomy  03/17/2022   Dr Venetia Night at [facility]   APPENDECTOMY           MEDICATIONS:  Outpatient Encounter Medications as of 12/06/2023  Medication Sig Dispense Refill   DULoxetine (CYMBALTA) 60 MG DR capsule Take 1 capsule (60 mg total) by mouth once daily 90 capsule 3   estrogens, esterified,-methylTESTOSTERone (ESTRATEST) 1.25-2.5 mg tablet Take 1 tablet by mouth once daily 30 tablet 4   famotidine (PEPCID) 40 MG tablet Take 1 tablet (40 mg total) by mouth at bedtime 30 tablet 6   gabapentin (NEURONTIN) 300 MG capsule TAKE 1 CAPSULE BY MOUTH THREE TIMES DAILY 240 capsule 0   levothyroxine (EUTHYROX) 75 MCG tablet Take 1 tablet (75 mcg total) by mouth once daily Take on an empty stomach with a glass of water at least 30-60 minutes before breakfast. 90 tablet 3   medroxyPROGESTERone (PROVERA) 2.5 MG tablet Take 1 tablet (2.5 mg total) by mouth once daily 90 tablet 3   pantoprazole (PROTONIX) 40 MG DR tablet Take 1 tablet (40 mg total) by mouth once daily Take it on empty stomach at least 30 to 40 minutes prior to eating every  morning 30 tablet 2   triamcinolone 0.5 % cream Apply topically 2 (two) times daily 30 g 0   sodium, potassium, and magnesium (SUPREP) oral solution Take 1 Bottle by mouth as directed One kit contains 2 bottles.  Take both bottles at the times instructed by your provider. (Patient not taking: Reported on 12/06/2023) 354 mL 0   sodium, potassium, and magnesium (SUPREP) oral solution Take 1 Bottle by mouth as directed One kit contains 2 bottles.  Take both bottles at the times instructed by your provider. (Patient not taking: Reported on 12/06/2023) 354 mL 0   No facility-administered encounter medications on file as of 12/06/2023.     ALLERGIES:   Hydrocodone-acetaminophen   SOCIAL HISTORY:  Social History   Socioeconomic History   Marital status: Single  Tobacco Use   Smoking status: Former    Current packs/day: 0.00    Types: Cigarettes    Quit date: 2013    Years since quitting: 12.1   Smokeless tobacco: Never  Vaping Use   Vaping status: Never Used  Substance and Sexual Activity   Alcohol use: Yes    Comment: occ   Drug use: Never   Sexual activity: Defer  Social History Narrative   ** Merged History Encounter **       Social Drivers of Dispensing optician  Resource Strain: Medium Risk (11/14/2023)   Overall Financial Resource Strain (CARDIA)    Difficulty of Paying Living Expenses: Somewhat hard  Food Insecurity: No Food Insecurity (11/14/2023)   Hunger Vital Sign    Worried About Running Out of Food in the Last Year: Never true    Ran Out of Food in the Last Year: Never true  Transportation Needs: No Transportation Needs (11/14/2023)   PRAPARE - Administrator, Civil Service (Medical): No    Lack of Transportation (Non-Medical): No    FAMILY HISTORY:  Family History  Problem Relation Name Age of Onset   Heart disease Mother     Heart disease Father     No Known Problems Sister     Heart disease Brother     Stroke Brother       GENERAL REVIEW OF  SYSTEMS:   General ROS: negative for - chills, fatigue, fever, weight gain or weight loss Allergy and Immunology ROS: negative for - hives  Hematological and Lymphatic ROS: negative for - bleeding problems or bruising, negative for palpable nodes Endocrine ROS: negative for - heat or cold intolerance, hair changes Respiratory ROS: negative for - cough, shortness of breath or wheezing Cardiovascular ROS: no chest pain or palpitations GI ROS: Positive for nausea, abdominal pain Musculoskeletal ROS: negative for - joint swelling or muscle pain Neurological ROS: negative for - confusion, syncope Dermatological ROS: negative for pruritus and rash  PHYSICAL EXAM:  Vitals:   12/06/23 1505  BP: (!) 141/93  Pulse: 83  .  Ht:157.5 cm (5' 2.01") Wt:62.8 kg (138 lb 7.2 oz) UJW:JXBJ surface area is 1.66 meters squared. Body mass index is 25.32 kg/m.Marland Kitchen   GENERAL: Alert, active, oriented x3  HEENT: Pupils equal reactive to light. Extraocular movements are intact. Sclera clear. Palpebral conjunctiva normal red color.Pharynx clear.  NECK: Supple with no palpable mass and no adenopathy.  LUNGS: Sound clear with no rales rhonchi or wheezes.  HEART: Regular rhythm S1 and S2 without murmur.  ABDOMEN: Soft and depressible, nontender with no palpable mass, no hepatomegaly.Marland Kitchen  EXTREMITIES: Well-developed well-nourished symmetrical with no dependent edema.  NEUROLOGICAL: Awake alert oriented, facial expression symmetrical, moving all extremities.    Assessment & Plan Patient with epigastric and right upper quadrant pain found with large on MRCP.  Patient also with chronic pancreatitis.  GI has rule out other causes of pancreatitis.  They suggest cholecystectomy with cholangiogram for further evaluation due to persistently elevated lipase.  Normal bilirubin.  I discussed this recommendation with the patient.  Discussed minimally invasive cholecystectomy.  Discussed the risks of surgery including  bleeding, infection,, bile duct injury, bile leak, injury to adjacent organ, among others.  The patient reports she understood and agreed to proceed with cholecystectomy.  She understand that the goal of surgery is to improve her right upper quadrant pain and hopefully decrease the risk of pancreatitis.   Cholelithiasis without cholecystitis [K80.20]          Patient verbalized understanding, all questions were answered, and were agreeable with the plan outlined above.   Carolan Shiver, MD  Electronically signed by Carolan Shiver, MD

## 2023-12-07 NOTE — H&P (View-Only) (Signed)
 History of Present Illness Patient says she has been having upper abdominal pain since August 2024.  She endorses that the pain is mainly in epigastric area and right upper quadrant.  Sometimes feel nauseated but denies vomiting.  Patient has tried Protonix for pain without improvement.  No alleviating or aggravating factors.  She was evaluated by gastroenterology and she was found with elevated lipase.  She had an MRCP that shows a large.  No common bile duct dilation.  No sign of choledocholithiasis.  I personally evaluated the images.  GI recommended cholecystectomy.      PAST MEDICAL HISTORY:  Past Medical History:  Diagnosis Date   Anxiety    Depression    Hypothyroidism    Kidney stone    Thyroid disease         PAST SURGICAL HISTORY:   Past Surgical History:  Procedure Laterality Date   KYPHOPLASTY  11/20/2021   L5   Left L5-S1 far lateral discectomy  03/17/2022   Dr Venetia Night at [facility]   APPENDECTOMY           MEDICATIONS:  Outpatient Encounter Medications as of 12/06/2023  Medication Sig Dispense Refill   DULoxetine (CYMBALTA) 60 MG DR capsule Take 1 capsule (60 mg total) by mouth once daily 90 capsule 3   estrogens, esterified,-methylTESTOSTERone (ESTRATEST) 1.25-2.5 mg tablet Take 1 tablet by mouth once daily 30 tablet 4   famotidine (PEPCID) 40 MG tablet Take 1 tablet (40 mg total) by mouth at bedtime 30 tablet 6   gabapentin (NEURONTIN) 300 MG capsule TAKE 1 CAPSULE BY MOUTH THREE TIMES DAILY 240 capsule 0   levothyroxine (EUTHYROX) 75 MCG tablet Take 1 tablet (75 mcg total) by mouth once daily Take on an empty stomach with a glass of water at least 30-60 minutes before breakfast. 90 tablet 3   medroxyPROGESTERone (PROVERA) 2.5 MG tablet Take 1 tablet (2.5 mg total) by mouth once daily 90 tablet 3   pantoprazole (PROTONIX) 40 MG DR tablet Take 1 tablet (40 mg total) by mouth once daily Take it on empty stomach at least 30 to 40 minutes prior to eating every  morning 30 tablet 2   triamcinolone 0.5 % cream Apply topically 2 (two) times daily 30 g 0   sodium, potassium, and magnesium (SUPREP) oral solution Take 1 Bottle by mouth as directed One kit contains 2 bottles.  Take both bottles at the times instructed by your provider. (Patient not taking: Reported on 12/06/2023) 354 mL 0   sodium, potassium, and magnesium (SUPREP) oral solution Take 1 Bottle by mouth as directed One kit contains 2 bottles.  Take both bottles at the times instructed by your provider. (Patient not taking: Reported on 12/06/2023) 354 mL 0   No facility-administered encounter medications on file as of 12/06/2023.     ALLERGIES:   Hydrocodone-acetaminophen   SOCIAL HISTORY:  Social History   Socioeconomic History   Marital status: Single  Tobacco Use   Smoking status: Former    Current packs/day: 0.00    Types: Cigarettes    Quit date: 2013    Years since quitting: 12.1   Smokeless tobacco: Never  Vaping Use   Vaping status: Never Used  Substance and Sexual Activity   Alcohol use: Yes    Comment: occ   Drug use: Never   Sexual activity: Defer  Social History Narrative   ** Merged History Encounter **       Social Drivers of Dispensing optician  Resource Strain: Medium Risk (11/14/2023)   Overall Financial Resource Strain (CARDIA)    Difficulty of Paying Living Expenses: Somewhat hard  Food Insecurity: No Food Insecurity (11/14/2023)   Hunger Vital Sign    Worried About Running Out of Food in the Last Year: Never true    Ran Out of Food in the Last Year: Never true  Transportation Needs: No Transportation Needs (11/14/2023)   PRAPARE - Administrator, Civil Service (Medical): No    Lack of Transportation (Non-Medical): No    FAMILY HISTORY:  Family History  Problem Relation Name Age of Onset   Heart disease Mother     Heart disease Father     No Known Problems Sister     Heart disease Brother     Stroke Brother       GENERAL REVIEW OF  SYSTEMS:   General ROS: negative for - chills, fatigue, fever, weight gain or weight loss Allergy and Immunology ROS: negative for - hives  Hematological and Lymphatic ROS: negative for - bleeding problems or bruising, negative for palpable nodes Endocrine ROS: negative for - heat or cold intolerance, hair changes Respiratory ROS: negative for - cough, shortness of breath or wheezing Cardiovascular ROS: no chest pain or palpitations GI ROS: Positive for nausea, abdominal pain Musculoskeletal ROS: negative for - joint swelling or muscle pain Neurological ROS: negative for - confusion, syncope Dermatological ROS: negative for pruritus and rash  PHYSICAL EXAM:  Vitals:   12/06/23 1505  BP: (!) 141/93  Pulse: 83  .  Ht:157.5 cm (5' 2.01") Wt:62.8 kg (138 lb 7.2 oz) UJW:JXBJ surface area is 1.66 meters squared. Body mass index is 25.32 kg/m.Marland Kitchen   GENERAL: Alert, active, oriented x3  HEENT: Pupils equal reactive to light. Extraocular movements are intact. Sclera clear. Palpebral conjunctiva normal red color.Pharynx clear.  NECK: Supple with no palpable mass and no adenopathy.  LUNGS: Sound clear with no rales rhonchi or wheezes.  HEART: Regular rhythm S1 and S2 without murmur.  ABDOMEN: Soft and depressible, nontender with no palpable mass, no hepatomegaly.Marland Kitchen  EXTREMITIES: Well-developed well-nourished symmetrical with no dependent edema.  NEUROLOGICAL: Awake alert oriented, facial expression symmetrical, moving all extremities.    Assessment & Plan Patient with epigastric and right upper quadrant pain found with large on MRCP.  Patient also with chronic pancreatitis.  GI has rule out other causes of pancreatitis.  They suggest cholecystectomy with cholangiogram for further evaluation due to persistently elevated lipase.  Normal bilirubin.  I discussed this recommendation with the patient.  Discussed minimally invasive cholecystectomy.  Discussed the risks of surgery including  bleeding, infection,, bile duct injury, bile leak, injury to adjacent organ, among others.  The patient reports she understood and agreed to proceed with cholecystectomy.  She understand that the goal of surgery is to improve her right upper quadrant pain and hopefully decrease the risk of pancreatitis.   Cholelithiasis without cholecystitis [K80.20]          Patient verbalized understanding, all questions were answered, and were agreeable with the plan outlined above.   Carolan Shiver, MD  Electronically signed by Carolan Shiver, MD

## 2023-12-12 ENCOUNTER — Encounter
Admission: RE | Admit: 2023-12-12 | Discharge: 2023-12-12 | Disposition: A | Discharge status: Home / Self Care | Payer: Medicare Other | Source: Ambulatory Visit | Attending: General Surgery | Admitting: General Surgery

## 2023-12-12 ENCOUNTER — Encounter: Payer: Self-pay | Admitting: General Surgery

## 2023-12-12 ENCOUNTER — Other Ambulatory Visit: Payer: Self-pay

## 2023-12-12 VITALS — Ht 62.0 in | Wt 147.0 lb

## 2023-12-12 DIAGNOSIS — K8020 Calculus of gallbladder without cholecystitis without obstruction: Secondary | ICD-10-CM

## 2023-12-12 NOTE — Patient Instructions (Addendum)
 Your procedure is scheduled on: 12/14/2023 Wednesday Report to the Registration Desk on the 1st floor of the [facility]. To find out your arrival time, please call [phone] between 1PM - 3PM on: 12/13/2023 Tuesday  If your arrival time is 6:00 am, do not arrive before that time as the [facility] entrance doors do not open until 6:00 am.  REMEMBER: Instructions that are not followed completely may result in serious medical risk, up to and including death; or upon the discretion of your surgeon and anesthesiologist your surgery may need to be rescheduled.  Do not eat food or drink after midnight the night before surgery.  No gum chewing or hard candies.   One week prior to surgery: Stop Anti-inflammatories (NSAIDS) such as Advil, Aleve, Ibuprofen, Motrin, Naproxen, Naprosyn and Aspirin based products such as Excedrin, Goody's Powder, BC Powder. Stop ANY OVER THE COUNTER supplements until after surgery. ascorbic acid (VITAMIN C, Cyanocobalamin (VITAMIN B-12 PO,    Homeopathic Products (ALLERGY MEDICINE PO), Magnesium 250 MG TABS,vitamin E ,  You may however, continue to take Tylenol if needed for pain up until the day of surgery.    Last dose of  ASPIRIN  would have been 12/07/2022 per your surgeon  special instructions. Today is 2/24. Please follow his instructions.  Continue taking all of your other prescription medications up until the day of surgery.  ON THE DAY OF SURGERY ONLY TAKE THESE MEDICATIONS WITH SIPS OF WATER:  DULoxetine (CYMBALTA) pantoprazole (PROTONIX)- as needed levothyroxine (SYNTHROID) 75 MCG tablet   No Alcohol for 24 hours before or after surgery.  No Smoking including e-cigarettes for 24 hours before surgery.  No chewable tobacco products for at least 6 hours before surgery.  No nicotine patches on the day of surgery.  Do not use any "recreational" drugs for at least a week (preferably 2 weeks) before your surgery.  Please be advised that the  combination of cocaine and anesthesia may have negative outcomes, up to and including death. If you test positive for cocaine, your surgery will be cancelled.  On the morning of surgery brush your teeth with toothpaste and water, you may rinse your mouth with mouthwash if you wish. Do not swallow any toothpaste or mouthwash.  Use CHG Soap or wipes as directed on instruction sheet.-provided for you   Do not wear jewelry, make-up, hairpins, clips or nail polish.  For welded (permanent) jewelry: bracelets, anklets, waist bands, etc.  Please have this removed prior to surgery.  If it is not removed, there is a chance that hospital personnel will need to cut it off on the day of surgery.  Do not wear lotions, powders, or perfumes.   Do not shave body hair from the neck down 48 hours before surgery.  Contact lenses, hearing aids and dentures may not be worn into surgery.  Do not bring valuables to the hospital. [facility] is not responsible for any missing/lost belongings or valuables.    Notify your doctor if there is any change in your medical condition (cold, fever, infection).  Wear comfortable clothing (specific to your surgery type) to the hospital.  After surgery, you can help prevent lung complications by doing breathing exercises.  Take deep breaths and cough every 1-2 hours. Your doctor may order a device called an Incentive Spirometer to help you take deep breaths. When coughing or sneezing, hold a pillow firmly against your incision with both hands. This is called "splinting." Doing this helps protect your incision. It  also decreases belly discomfort.    If you are being discharged the day of surgery, you will not be allowed to drive home. You will need a responsible individual to drive you home and stay with you for 24 hours after surgery.    Please call the Pre-admissions Testing Dept. at [phone] if you have any questions about these instructions.  Surgery  Visitation Policy:  Patients having surgery or a procedure may have two visitors.  Children under the age of 61 must have an adult with them who is not the patient.  Temporary Visitor Restrictions Due to increasing cases of flu, RSV and COVID-19: Children ages 27 and under will not be able to visit patients in [facility] hospitals under most circumstances.  Inpatient Visitation:    Visiting hours are 7 a.m. to 8 p.m. Up to four visitors are allowed at one time in a patient room. The visitors may rotate out with other people during the day.  One visitor age 51 or older may stay with the patient overnight and must be in the room by 8 p.m.

## 2023-12-13 ENCOUNTER — Encounter: Payer: Self-pay | Admitting: Urgent Care

## 2023-12-13 ENCOUNTER — Encounter
Admission: RE | Admit: 2023-12-13 | Discharge: 2023-12-13 | Discharge status: Home / Self Care | Payer: Medicare Other | Source: Ambulatory Visit | Attending: General Surgery

## 2023-12-13 DIAGNOSIS — Z01810 Encounter for preprocedural cardiovascular examination: Secondary | ICD-10-CM | POA: Insufficient documentation

## 2023-12-13 DIAGNOSIS — K8020 Calculus of gallbladder without cholecystitis without obstruction: Secondary | ICD-10-CM | POA: Diagnosis not present

## 2023-12-13 MED ORDER — INDOCYANINE GREEN 25 MG IV SOLR
1.2500 mg | Freq: Once | INTRAVENOUS | Status: AC
  Administered 2023-12-14: 1.25 mg via INTRAVENOUS

## 2023-12-13 MED ORDER — LACTATED RINGERS IV SOLN: INTRAVENOUS | Status: DC

## 2023-12-13 MED ORDER — CEFAZOLIN SODIUM-DEXTROSE 2-4 GM/100ML-% IV SOLN
2.0000 g | INTRAVENOUS | Status: AC
  Administered 2023-12-14: 2 g via INTRAVENOUS

## 2023-12-13 MED ORDER — ORAL CARE MOUTH RINSE: 15.0000 mL | Freq: Once | OROMUCOSAL | Status: AC

## 2023-12-13 MED ORDER — CHLORHEXIDINE GLUCONATE 0.12 % MT SOLN
15.0000 mL | Freq: Once | OROMUCOSAL | Status: AC
Start: 2023-12-13 — End: 2023-12-14
  Administered 2023-12-14: 15 mL via OROMUCOSAL

## 2023-12-14 ENCOUNTER — Ambulatory Visit: Payer: Medicare Other | Admitting: Anesthesiology

## 2023-12-14 ENCOUNTER — Ambulatory Visit
Admission: RE | Admit: 2023-12-14 | Discharge: 2023-12-14 | Disposition: A | Discharge status: Home / Self Care | Payer: Medicare Other | Attending: General Surgery | Admitting: General Surgery

## 2023-12-14 ENCOUNTER — Other Ambulatory Visit: Payer: Self-pay

## 2023-12-14 ENCOUNTER — Encounter
Admission: RE | Disposition: A | Discharge status: Home / Self Care | Payer: Self-pay | Source: Home / Self Care | Attending: General Surgery

## 2023-12-14 ENCOUNTER — Encounter: Payer: Self-pay | Admitting: General Surgery

## 2023-12-14 ENCOUNTER — Ambulatory Visit: Payer: Medicare Other

## 2023-12-14 DIAGNOSIS — Z87891 Personal history of nicotine dependence: Secondary | ICD-10-CM | POA: Insufficient documentation

## 2023-12-14 DIAGNOSIS — F32A Depression, unspecified: Secondary | ICD-10-CM | POA: Diagnosis not present

## 2023-12-14 DIAGNOSIS — E039 Hypothyroidism, unspecified: Secondary | ICD-10-CM | POA: Diagnosis not present

## 2023-12-14 DIAGNOSIS — F419 Anxiety disorder, unspecified: Secondary | ICD-10-CM | POA: Insufficient documentation

## 2023-12-14 DIAGNOSIS — K861 Other chronic pancreatitis: Secondary | ICD-10-CM | POA: Diagnosis not present

## 2023-12-14 DIAGNOSIS — K8010 Calculus of gallbladder with chronic cholecystitis without obstruction: Secondary | ICD-10-CM | POA: Insufficient documentation

## 2023-12-14 HISTORY — PX: INTRAOPERATIVE CHOLANGIOGRAM: SHX5230

## 2023-12-14 HISTORY — DX: Calculus of gallbladder without cholecystitis without obstruction: K80.20

## 2023-12-14 SURGERY — CHOLECYSTECTOMY, ROBOT-ASSISTED, LAPAROSCOPIC
Anesthesia: General | Site: Abdomen

## 2023-12-14 MED ORDER — PROPOFOL 10 MG/ML IV BOLUS
INTRAVENOUS | Status: AC
  Filled 2023-12-14: qty 20

## 2023-12-14 MED ORDER — HYDROMORPHONE HCL 1 MG/ML IJ SOLN
INTRAMUSCULAR | Status: AC
  Filled 2023-12-14: qty 1

## 2023-12-14 MED ORDER — PHENYLEPHRINE 80 MCG/ML (10ML) SYRINGE FOR IV PUSH (FOR BLOOD PRESSURE SUPPORT)
PREFILLED_SYRINGE | INTRAVENOUS | Status: AC
  Filled 2023-12-14: qty 10

## 2023-12-14 MED ORDER — HYDROMORPHONE HCL 1 MG/ML IJ SOLN
INTRAMUSCULAR | Status: DC | PRN
  Administered 2023-12-14: .25 mg via INTRAVENOUS
  Administered 2023-12-14: .5 mg via INTRAVENOUS
  Administered 2023-12-14: .25 mg via INTRAVENOUS

## 2023-12-14 MED ORDER — FENTANYL CITRATE (PF) 100 MCG/2ML IJ SOLN
INTRAMUSCULAR | Status: AC
  Filled 2023-12-14: qty 2

## 2023-12-14 MED ORDER — BUPIVACAINE-EPINEPHRINE (PF) 0.25% -1:200000 IJ SOLN
INTRAMUSCULAR | Status: AC
  Filled 2023-12-14: qty 30

## 2023-12-14 MED ORDER — FENTANYL CITRATE (PF) 100 MCG/2ML IJ SOLN
25.0000 ug | INTRAMUSCULAR | Status: DC | PRN
  Administered 2023-12-14: 25 ug via INTRAVENOUS
  Administered 2023-12-14: 50 ug via INTRAVENOUS
  Administered 2023-12-14: 25 ug via INTRAVENOUS

## 2023-12-14 MED ORDER — PHENYLEPHRINE 80 MCG/ML (10ML) SYRINGE FOR IV PUSH (FOR BLOOD PRESSURE SUPPORT)
PREFILLED_SYRINGE | INTRAVENOUS | Status: DC | PRN
  Administered 2023-12-14 (×2): 80 ug via INTRAVENOUS

## 2023-12-14 MED ORDER — ONDANSETRON HCL 4 MG/2ML IJ SOLN
INTRAMUSCULAR | Status: AC
  Filled 2023-12-14: qty 2

## 2023-12-14 MED ORDER — DEXAMETHASONE SODIUM PHOSPHATE 10 MG/ML IJ SOLN
INTRAMUSCULAR | Status: DC | PRN
  Administered 2023-12-14: 10 mg via INTRAVENOUS

## 2023-12-14 MED ORDER — LIDOCAINE HCL (CARDIAC) PF 100 MG/5ML IV SOSY
PREFILLED_SYRINGE | INTRAVENOUS | Status: DC | PRN
  Administered 2023-12-14: 100 mg via INTRAVENOUS

## 2023-12-14 MED ORDER — ACETAMINOPHEN 10 MG/ML IV SOLN
INTRAVENOUS | Status: AC
  Filled 2023-12-14: qty 100

## 2023-12-14 MED ORDER — OXYCODONE HCL 5 MG/5ML PO SOLN: 5.0000 mg | Freq: Once | ORAL | Status: AC | PRN

## 2023-12-14 MED ORDER — INDOCYANINE GREEN 25 MG IV SOLR
INTRAVENOUS | Status: AC
  Filled 2023-12-14: qty 10

## 2023-12-14 MED ORDER — PROPOFOL 10 MG/ML IV BOLUS
INTRAVENOUS | Status: DC | PRN
  Administered 2023-12-14: 150 mg via INTRAVENOUS

## 2023-12-14 MED ORDER — DEXMEDETOMIDINE HCL IN NACL 80 MCG/20ML IV SOLN
INTRAVENOUS | Status: DC | PRN
Start: 2023-12-14 — End: 2023-12-14
  Administered 2023-12-14: 8 ug via INTRAVENOUS

## 2023-12-14 MED ORDER — FENTANYL CITRATE (PF) 100 MCG/2ML IJ SOLN
INTRAMUSCULAR | Status: DC | PRN
  Administered 2023-12-14 (×4): 50 ug via INTRAVENOUS

## 2023-12-14 MED ORDER — SODIUM CHLORIDE (PF) 0.9 % IJ SOLN
INTRAMUSCULAR | Status: AC
  Filled 2023-12-14: qty 20

## 2023-12-14 MED ORDER — BUPIVACAINE-EPINEPHRINE 0.25% -1:200000 IJ SOLN
INTRAMUSCULAR | Status: DC | PRN
  Administered 2023-12-14: 30 mL

## 2023-12-14 MED ORDER — 0.9 % SODIUM CHLORIDE (POUR BTL) OPTIME
TOPICAL | Status: DC | PRN
  Administered 2023-12-14: 500 mL

## 2023-12-14 MED ORDER — CEFAZOLIN SODIUM-DEXTROSE 2-4 GM/100ML-% IV SOLN
INTRAVENOUS | Status: AC
  Filled 2023-12-14: qty 100

## 2023-12-14 MED ORDER — HYDROCODONE-ACETAMINOPHEN 5-325 MG PO TABS: 1.0000 | ORAL_TABLET | Freq: Four times a day (QID) | ORAL | 0 refills | Status: AC | PRN

## 2023-12-14 MED ORDER — ROCURONIUM BROMIDE 100 MG/10ML IV SOLN
INTRAVENOUS | Status: DC | PRN
  Administered 2023-12-14: 50 mg via INTRAVENOUS
  Administered 2023-12-14 (×2): 10 mg via INTRAVENOUS

## 2023-12-14 MED ORDER — ROCURONIUM BROMIDE 10 MG/ML (PF) SYRINGE
PREFILLED_SYRINGE | INTRAVENOUS | Status: AC
  Filled 2023-12-14: qty 10

## 2023-12-14 MED ORDER — ACETAMINOPHEN 10 MG/ML IV SOLN
INTRAVENOUS | Status: DC | PRN
  Administered 2023-12-14: 1000 mg via INTRAVENOUS

## 2023-12-14 MED ORDER — IOHEXOL 180 MG/ML  SOLN
INTRAMUSCULAR | Status: DC | PRN
  Administered 2023-12-14: 10 mL

## 2023-12-14 MED ORDER — OXYCODONE HCL 5 MG PO TABS
5.0000 mg | ORAL_TABLET | Freq: Once | ORAL | Status: AC | PRN
  Administered 2023-12-14: 5 mg via ORAL

## 2023-12-14 MED ORDER — LIDOCAINE HCL (PF) 2 % IJ SOLN
INTRAMUSCULAR | Status: AC
  Filled 2023-12-14: qty 5

## 2023-12-14 MED ORDER — ONDANSETRON HCL 4 MG/2ML IJ SOLN
INTRAMUSCULAR | Status: DC | PRN
  Administered 2023-12-14: 4 mg via INTRAVENOUS

## 2023-12-14 MED ORDER — SUGAMMADEX SODIUM 200 MG/2ML IV SOLN
INTRAVENOUS | Status: AC
  Filled 2023-12-14: qty 2

## 2023-12-14 MED ORDER — DEXAMETHASONE SODIUM PHOSPHATE 10 MG/ML IJ SOLN
INTRAMUSCULAR | Status: AC
  Filled 2023-12-14: qty 1

## 2023-12-14 MED ORDER — SUGAMMADEX SODIUM 200 MG/2ML IV SOLN
INTRAVENOUS | Status: DC | PRN
  Administered 2023-12-14: 200 mg via INTRAVENOUS

## 2023-12-14 MED ORDER — CHLORHEXIDINE GLUCONATE 0.12 % MT SOLN
OROMUCOSAL | Status: AC
  Filled 2023-12-14: qty 15

## 2023-12-14 MED ORDER — OXYCODONE HCL 5 MG PO TABS
ORAL_TABLET | ORAL | Status: AC
  Filled 2023-12-14: qty 1

## 2023-12-14 SURGICAL SUPPLY — 49 items
BAG PRESSURE INF REUSE 1000 (BAG) IMPLANT
CANNULA REDUCER 12-8 DVNC XI (CANNULA) ×1 IMPLANT
CATH REDDICK CHOLANGI 4FR 50CM (CATHETERS) IMPLANT
CAUTERY HOOK MNPLR 1.6 DVNC XI (INSTRUMENTS) ×1 IMPLANT
CLIP LIGATING HEM O LOK PURPLE (MISCELLANEOUS) IMPLANT
CLIP LIGATING HEMO O LOK GREEN (MISCELLANEOUS) ×1 IMPLANT
DERMABOND ADVANCED .7 DNX12 (GAUZE/BANDAGES/DRESSINGS) ×1 IMPLANT
DRAPE ARM DVNC X/XI (DISPOSABLE) ×4 IMPLANT
DRAPE C-ARM XRAY 36X54 (DRAPES) IMPLANT
DRAPE COLUMN DVNC XI (DISPOSABLE) ×1 IMPLANT
ELECT REM PT RETURN 9FT ADLT (ELECTROSURGICAL) ×1 IMPLANT
ELECTRODE REM PT RTRN 9FT ADLT (ELECTROSURGICAL) ×1 IMPLANT
FORCEPS BPLR 8 MD DVNC XI (FORCEP) ×1 IMPLANT
FORCEPS BPLR FENES DVNC XI (FORCEP) ×1 IMPLANT
FORCEPS PROGRASP DVNC XI (FORCEP) ×1 IMPLANT
GLOVE BIO SURGEON STRL SZ 6.5 (GLOVE) ×2 IMPLANT
GLOVE BIOGEL PI IND STRL 6.5 (GLOVE) ×2 IMPLANT
GOWN STRL REUS W/ TWL LRG LVL3 (GOWN DISPOSABLE) ×3 IMPLANT
GRASPER SUT TROCAR 14GX15 (MISCELLANEOUS) ×1 IMPLANT
IRRIGATION STRYKERFLOW (MISCELLANEOUS) IMPLANT
IRRIGATOR STRYKERFLOW (MISCELLANEOUS) ×1 IMPLANT
IRRIGATOR SUCT 8 DISP DVNC XI (IRRIGATION / IRRIGATOR) IMPLANT
IV CATH ANGIO 12GX3 LT BLUE (NEEDLE) IMPLANT
IV NS 1000ML BAXH (IV SOLUTION) IMPLANT
KIT PINK PAD W/HEAD ARE REST (MISCELLANEOUS) ×1 IMPLANT
KIT PINK PAD W/HEAD ARM REST (MISCELLANEOUS) ×1 IMPLANT
LABEL OR SOLS (LABEL) ×1 IMPLANT
MANIFOLD NEPTUNE II (INSTRUMENTS) ×1 IMPLANT
NDL HYPO 22X1.5 SAFETY MO (MISCELLANEOUS) ×1 IMPLANT
NDL INSUFFLATION 14GA 120MM (NEEDLE) ×1 IMPLANT
NEEDLE HYPO 22X1.5 SAFETY MO (MISCELLANEOUS) ×1 IMPLANT
NEEDLE INSUFFLATION 14GA 120MM (NEEDLE) ×1 IMPLANT
NS IRRIG 500ML POUR BTL (IV SOLUTION) ×1 IMPLANT
OBTURATOR OPTICAL STND 8 DVNC (TROCAR) ×1 IMPLANT
OBTURATOR OPTICALSTD 8 DVNC (TROCAR) ×1 IMPLANT
PACK LAP CHOLECYSTECTOMY (MISCELLANEOUS) ×1 IMPLANT
SEAL UNIV 5-12 XI (MISCELLANEOUS) ×4 IMPLANT
SET TUBE SMOKE EVAC HIGH FLOW (TUBING) ×1 IMPLANT
SOL ELECTROSURG ANTI STICK (MISCELLANEOUS) ×1 IMPLANT
SOLUTION ELECTROSURG ANTI STCK (MISCELLANEOUS) ×1 IMPLANT
SPIKE FLUID TRANSFER (MISCELLANEOUS) ×2 IMPLANT
SPONGE T-LAP 4X18 ~~LOC~~+RFID (SPONGE) IMPLANT
SUT MNCRL 4-0 27 PS-2 XMFL (SUTURE) ×1 IMPLANT
SUT VICRYL 0 UR6 27IN ABS (SUTURE) ×1 IMPLANT
SUTURE MNCRL 4-0 27XMF (SUTURE) ×1 IMPLANT
SYS BAG RETRIEVAL 10MM (BASKET) ×1 IMPLANT
SYSTEM BAG RETRIEVAL 10MM (BASKET) ×1 IMPLANT
TRAP FLUID SMOKE EVACUATOR (MISCELLANEOUS) ×1 IMPLANT
WATER STERILE IRR 500ML POUR (IV SOLUTION) ×1 IMPLANT

## 2023-12-14 NOTE — Interval H&P Note (Signed)
 History and Physical Interval Note:  12/14/2023 3:58 PM  Amanda Gonzales  has presented today for surgery, with the diagnosis of K80.20 Cholelithiasis w/o cholecystitis.  The various methods of treatment have been discussed with the patient and family. After consideration of risks, benefits and other options for treatment, the patient has consented to  Procedure(s): XI ROBOTIC ASSISTED LAPAROSCOPIC CHOLECYSTECTOMY (N/A) INTRAOPERATIVE CHOLANGIOGRAM (N/A) as a surgical intervention.  The patient's history has been reviewed, patient examined, no change in status, stable for surgery.  I have reviewed the patient's chart and labs.  Questions were answered to the patient's satisfaction.     Carolan Shiver

## 2023-12-14 NOTE — Anesthesia Preprocedure Evaluation (Signed)
 Anesthesia Evaluation  Patient identified by MRN, date of birth, ID band Patient awake    Reviewed: Allergy & Precautions, NPO status , Patient's Chart, lab work & pertinent test results  History of Anesthesia Complications Negative for: history of anesthetic complications  Airway Mallampati: III  TM Distance: >3 FB Neck ROM: full    Dental  (+) Lower Dentures, Upper Dentures   Pulmonary neg pulmonary ROS, former smoker   Pulmonary exam normal        Cardiovascular negative cardio ROS Normal cardiovascular exam     Neuro/Psych  PSYCHIATRIC DISORDERS Anxiety Depression    negative neurological ROS     GI/Hepatic negative GI ROS, Neg liver ROS,,,  Endo/Other  Hypothyroidism    Renal/GU      Musculoskeletal  (+) Arthritis ,    Abdominal   Peds  Hematology negative hematology ROS (+)   Anesthesia Other Findings Past Medical History: 1984: Abnormal Pap smear of cervix No date: Anxiety No date: Arthritis No date: Cancer [facility])     Comment:  cervical No date: Cholelithiasis without cholecystitis No date: Compression fracture of L5 vertebra (HCC) No date: Depression No date: Family history of breast cancer in female     Comment:  5/22 cancer genetic testing letter sent No date: History of kidney stones No date: Hypothyroidism No date: Long term current use of aspirin No date: Pneumonia No date: Psoriasis  Past Surgical History: 1976: APPENDECTOMY No date: BACK SURGERY No date: BREAST SURGERY     Comment:  breast implants No date: COLONOSCOPY 1980: DILATION AND CURETTAGE OF UTERUS     Comment:  MAB 2021: EYE SURGERY     Comment:  Cataract 1980: facial surgery due to over bite  11/20/2021: IR KYPHO LUMBAR INC FX REDUCE BONE BX UNI/BIL CANNULATION  INC/IMAGING 11/12/2021: IR RADIOLOGIST EVAL & MGMT 12/03/2021: IR RADIOLOGIST EVAL & MGMT 02/2022: Left L5-S1 lateral discectomy No date: WISDOM TOOTH  EXTRACTION 2017: WRIST FRACTURE SURGERY; Left  BMI    Body Mass Index: 26.52 kg/m      Reproductive/Obstetrics negative OB ROS                             Anesthesia Physical Anesthesia Plan  ASA: 2  Anesthesia Plan: General ETT   Post-op Pain Management: Toradol IV (intra-op)*, Ofirmev IV (intra-op)* and Dilaudid IV   Induction: Intravenous  PONV Risk Score and Plan: 3 and Ondansetron, Dexamethasone, Midazolam and Treatment may vary due to age or medical condition  Airway Management Planned: Oral ETT  Additional Equipment:   Intra-op Plan:   Post-operative Plan: Extubation in OR  Informed Consent: I have reviewed the patients History and Physical, chart, labs and discussed the procedure including the risks, benefits and alternatives for the proposed anesthesia with the patient or authorized representative who has indicated his/her understanding and acceptance.     Dental Advisory Given  Plan Discussed with: Anesthesiologist, CRNA and Surgeon  Anesthesia Plan Comments: (Patient consented for risks of anesthesia including but not limited to:  - adverse reactions to medications - damage to eyes, teeth, lips or other oral mucosa - nerve damage due to positioning  - sore throat or hoarseness - Damage to heart, brain, nerves, lungs, other parts of body or loss of life  Patient voiced understanding and assent.)       Anesthesia Quick Evaluation

## 2023-12-14 NOTE — Discharge Instructions (Addendum)

## 2023-12-14 NOTE — Transfer of Care (Signed)
 Immediate Anesthesia Transfer of Care Note  Patient: Amanda Gonzales  Procedure(s) Performed: XI ROBOTIC ASSISTED LAPAROSCOPIC CHOLECYSTECTOMY (Abdomen) INTRAOPERATIVE CHOLANGIOGRAM (Abdomen)  Patient Location: PACU  Anesthesia Type:General  Level of Consciousness: awake  Airway & Oxygen Therapy: Patient Spontanous Breathing  Post-op Assessment: Report given to RN and Post -op Vital signs reviewed and stable  Post vital signs: Reviewed and stable  Last Vitals:  Vitals Value Taken Time  BP 146/78 12/14/23 1800  Temp    Pulse 92 12/14/23 1805  Resp 15 12/14/23 1805  SpO2 90 % 12/14/23 1805  Vitals shown include unfiled device data.  Last Pain:  Vitals:   12/14/23 1404  TempSrc: Temporal  PainSc: 4          Complications: No notable events documented.

## 2023-12-14 NOTE — Anesthesia Procedure Notes (Signed)
 Procedure Name: Intubation Date/Time: 12/14/2023 4:22 PM  Performed by: Otho Perl, CRNAPre-anesthesia Checklist: Patient identified, Patient being monitored, Timeout performed, Emergency Drugs available and Suction available Patient Re-evaluated:Patient Re-evaluated prior to induction Oxygen Delivery Method: Circle system utilized Preoxygenation: Pre-oxygenation with 100% oxygen Induction Type: IV induction Ventilation: Mask ventilation without difficulty Laryngoscope Size: 3 and McGrath Grade View: Grade I Tube type: Oral Tube size: 7.0 mm Number of attempts: 1 Airway Equipment and Method: Stylet Placement Confirmation: ETT inserted through vocal cords under direct vision, positive ETCO2 and breath sounds checked- equal and bilateral Secured at: 18 cm Tube secured with: Tape Dental Injury: Teeth and Oropharynx as per pre-operative assessment

## 2023-12-14 NOTE — Op Note (Signed)
 Preoperative diagnosis: Cholelithiasis  Postoperative diagnosis: Same  Procedure: Robotic Assisted Laparoscopic Cholecystectomy.   Anesthesia: GETA   Surgeon: Dr. Hazle Quant  Wound Classification: Clean Contaminated  Indications: Patient is a 68 y.o. female developed upper abdominal pain and on workup was found to have cholelithiasis. Robotic Assisted Laparoscopic cholecystectomy was elected.  Findings:  Critical view of safety achieved Cystic duct and artery identified, ligated and divided No obvious filling defect seen on cholangiogram Adequate hemostasis  Description of procedure: The patient was placed on the operating table in the supine position. General anesthesia was induced. A time-out was completed verifying correct patient, procedure, site, positioning, and implant(s) and/or special equipment prior to beginning this procedure. An orogastric tube was placed. The abdomen was prepped and draped in the usual sterile fashion.  An incision was made in a natural skin line below the umbilicus.  The fascia was elevated and the Veress needle inserted. Proper position was confirmed by aspiration and saline meniscus test.  The abdomen was insufflated with carbon dioxide to a pressure of 15 mmHg. The patient tolerated insufflation well. A 8-mm trocar was then inserted in optiview fashion.  The laparoscope was inserted and the abdomen inspected. No injuries from initial trocar placement were noted. Additional trocars were then inserted in the following locations: an 8-mm trocar in the left lateral abdomen, and another two 8-mm trocars to the right side of the abdomen 5 cm appart. The umbilical trocar was changed to a 12 mm trocar all under direct visualization. The abdomen was inspected and no abnormalities were found. The table was placed in the reverse Trendelenburg position with the right side up. The robotic arms were docked and target anatomy identified. Instrument inserted under direct  visualization.  Filmy adhesions between the gallbladder and omentum, duodenum and transverse colon were lysed with electrocautery. The dome of the gallbladder was grasped with a prograsp and retracted over the dome of the liver. The infundibulum was also grasped with an atraumatic grasper and retracted toward the right lower quadrant. This maneuver exposed Calot's triangle. The peritoneum overlying the gallbladder infundibulum was then incised and the cystic duct and cystic artery identified and circumferentially dissected. Critical view of safety reviewed before ligating any structure.  The cystic duct was ligated close little brother neck.  A small opening of the cystic duct was done close to the gallbladder.  Catheter was inserted.  Contrast was flushed through the catheter and cholangiogram images taken.  No obvious filling defect was appreciated on common bile duct.  The catheter was removed.  Firefly images taken to visualize biliary ducts. The cystic duct and cystic artery were then doubly clipped and divided close to the gallbladder.  The gallbladder was then dissected from its peritoneal attachments by electrocautery. Hemostasis was checked and the gallbladder and contained stones were removed using an endoscopic retrieval bag. The gallbladder was passed off the table as a specimen. The gallbladder fossa was copiously irrigated with saline and hemostasis was obtained. There was no evidence of bleeding from the gallbladder fossa or cystic artery or leakage of the bile from the cystic duct stump. Secondary trocars were removed under direct vision. No bleeding was noted. The robotic arms were undoked. The scope was withdrawn and the umbilical trocar removed. The abdomen was allowed to collapse. The fascia of the 12mm trocar sites was closed with figure-of-eight 0 vicryl sutures. The skin was closed with subcuticular sutures of 4-0 monocryl and topical skin adhesive. The orogastric tube was removed.  The  patient tolerated the procedure well and was taken to the postanesthesia care unit in stable condition.   Specimen: Gallbladder  Complications: None  EBL: 5 mL

## 2023-12-15 ENCOUNTER — Encounter: Payer: Self-pay | Admitting: General Surgery

## 2023-12-15 NOTE — Progress Notes (Signed)
 Patient doing well postoperatively, pain on left side controlled with medications and rest. Incision sites are intact per patient and non draining. No other concerns per patient.

## 2023-12-15 NOTE — Anesthesia Postprocedure Evaluation (Signed)
 Anesthesia Post Note  Patient: Amanda Gonzales  Procedure(s) Performed: XI ROBOTIC ASSISTED LAPAROSCOPIC CHOLECYSTECTOMY (Abdomen) INTRAOPERATIVE CHOLANGIOGRAM (Abdomen)  Patient location during evaluation: PACU Anesthesia Type: General Level of consciousness: awake and alert Pain management: pain level controlled Vital Signs Assessment: post-procedure vital signs reviewed and stable Respiratory status: spontaneous breathing, nonlabored ventilation, respiratory function stable and patient connected to nasal cannula oxygen Cardiovascular status: blood pressure returned to baseline and stable Postop Assessment: no apparent nausea or vomiting Anesthetic complications: no   No notable events documented.   Last Vitals:  Vitals:   12/14/23 1856 12/14/23 1900  BP: (!) 155/81 (!) 142/78  Pulse: 92 87  Resp: 16   Temp: (!) 36.3 C   SpO2: 94% 94%    Last Pain:  Vitals:   12/14/23 1900  TempSrc:   PainSc: 3                  Louie Boston

## 2023-12-16 LAB — SURGICAL PATHOLOGY

## 2024-02-17 ENCOUNTER — Ambulatory Visit

## 2024-02-17 DIAGNOSIS — K2100 Gastro-esophageal reflux disease with esophagitis, without bleeding: Secondary | ICD-10-CM

## 2024-02-17 DIAGNOSIS — K640 First degree hemorrhoids: Secondary | ICD-10-CM | POA: Diagnosis not present

## 2024-02-17 DIAGNOSIS — K449 Diaphragmatic hernia without obstruction or gangrene: Secondary | ICD-10-CM | POA: Diagnosis not present

## 2024-02-17 DIAGNOSIS — Z1211 Encounter for screening for malignant neoplasm of colon: Secondary | ICD-10-CM

## 2024-07-23 ENCOUNTER — Other Ambulatory Visit: Payer: Self-pay | Admitting: Internal Medicine

## 2024-07-23 DIAGNOSIS — R2231 Localized swelling, mass and lump, right upper limb: Secondary | ICD-10-CM

## 2024-07-25 ENCOUNTER — Ambulatory Visit
Admission: RE | Admit: 2024-07-25 | Discharge: 2024-07-25 | Disposition: A | Discharge status: Home / Self Care | Source: Ambulatory Visit | Attending: Internal Medicine | Admitting: Internal Medicine

## 2024-07-25 DIAGNOSIS — R2231 Localized swelling, mass and lump, right upper limb: Secondary | ICD-10-CM | POA: Diagnosis present

## 2024-10-12 ENCOUNTER — Other Ambulatory Visit: Payer: Self-pay

## 2024-10-12 ENCOUNTER — Ambulatory Visit
Admission: RE | Admit: 2024-10-12 | Discharge: 2024-10-12 | Disposition: A | Discharge status: Home / Self Care | Source: Ambulatory Visit

## 2024-10-12 DIAGNOSIS — N200 Calculus of kidney: Secondary | ICD-10-CM

## 2024-10-24 ENCOUNTER — Ambulatory Visit
Admission: RE | Admit: 2024-10-24 | Discharge: 2024-10-24 | Disposition: A | Discharge status: Home / Self Care | Source: Ambulatory Visit | Attending: Urology | Admitting: Urology

## 2024-10-24 ENCOUNTER — Other Ambulatory Visit: Payer: Self-pay

## 2024-10-24 ENCOUNTER — Ambulatory Visit: Admitting: Urology

## 2024-10-24 VITALS — BP 160/95 | HR 97 | Ht 62.0 in | Wt 137.0 lb

## 2024-10-24 DIAGNOSIS — N200 Calculus of kidney: Secondary | ICD-10-CM | POA: Insufficient documentation

## 2024-10-24 LAB — URINALYSIS, COMPLETE
Bilirubin, UA: NEGATIVE
Glucose, UA: NEGATIVE
Ketones, UA: NEGATIVE
Nitrite, UA: NEGATIVE
Protein,UA: NEGATIVE
Specific Gravity, UA: 1.01 (ref 1.005–1.030)
Urobilinogen, Ur: 0.2 mg/dL (ref 0.2–1.0)
pH, UA: 6 (ref 5.0–7.5)

## 2024-10-24 LAB — MICROSCOPIC EXAMINATION

## 2024-10-24 MED ORDER — SULFAMETHOXAZOLE-TRIMETHOPRIM 800-160 MG PO TABS: 1.0000 | ORAL_TABLET | Freq: Two times a day (BID) | ORAL | 0 refills | Status: AC

## 2024-10-24 MED ORDER — TRAMADOL HCL 50 MG PO TABS: 25.0000 mg | ORAL_TABLET | Freq: Four times a day (QID) | ORAL | 0 refills | Status: AC | PRN

## 2024-10-24 NOTE — Progress Notes (Signed)
 "  10/24/2024 10:29 AM   Arlyne BROCKS Inclan Apr 29, 1956 [id number]  CC: Flank pain, kidney stones  HPI: 69 year old female who was seen by PCP 10/12/2024 with a few weeks of a number of symptoms including abdominal pain, hip pain, low back pain bilaterally, and nausea.  She had evaluation with a urinalysis which was relatively benign with 2 RBC and 5 WBC, trace leukocytes, nitrite negative, no bacteria, and a CT scan 10/12/2024 showed a nonobstructive 1 cm left renal pelvis stone with no hydronephrosis, and right 3 mm lower pole nonobstructive stone.  CBC and BMP were benign.  She reports a history of stones that previously required shockwave lithotripsy in [facility], she did have some difficulty tolerating shockwave, however was on high-dose narcotics after a recent jaw surgery at that time.  She reports new onset of some urinary symptoms including dysuria, urgency/frequency, as well as 1 day of gross hematuria.  She took Aleve at 1 AM on 10/24/2024.  She has not been taking her daily low-dose aspirin.  She was prescribed oxycodone  and Tylenol  3 by PCP which has taken some of the edge off of her pain.  Continues to report moderate to severe pain in bilateral flanks that goes to the lower abdomen and upper legs.  Urinalysis today concerning for UTI with 11-30 WBC, 3-10 RBC, moderate bacteria, 1+ leukocytes, nitrite negative.  Will send for culture.   PMH: Past Medical History:  Diagnosis Date   Abnormal Pap smear of cervix 1984   Anxiety    Arthritis    Cancer (HCC)    cervical   Cholelithiasis without cholecystitis    Compression fracture of L5 vertebra (HCC)    Depression    Family history of breast cancer in female    5/22 cancer genetic testing letter sent   History of kidney stones    Hypothyroidism    Long term current use of aspirin    Pneumonia    Psoriasis     Surgical History: Past Surgical History:  Procedure Laterality Date   APPENDECTOMY  1976   BACK SURGERY      BREAST SURGERY     breast implants   COLONOSCOPY     DILATION AND CURETTAGE OF UTERUS  1980   MAB   EYE SURGERY  2021   Cataract   facial surgery due to over bite   1980   INTRAOPERATIVE CHOLANGIOGRAM N/A 12/14/2023   Procedure: INTRAOPERATIVE CHOLANGIOGRAM;  Surgeon: Rodolph Romano, MD;  Location: [facility] ORS;  Service: General;  Laterality: N/A;   IR KYPHO LUMBAR INC FX REDUCE BONE BX UNI/BIL CANNULATION INC/IMAGING  11/20/2021   IR RADIOLOGIST EVAL & MGMT  11/12/2021   IR RADIOLOGIST EVAL & MGMT  12/03/2021   Left L5-S1 lateral discectomy  02/2022   WISDOM TOOTH EXTRACTION     WRIST FRACTURE SURGERY Left 2017    Family History: Family History  Problem Relation Age of Onset   Breast cancer Brother 16   Stroke Brother    Thyroid disease Maternal Grandmother    Heart failure Mother    Heart failure Father     Social History:  reports that she has quit smoking. Her smoking use included cigarettes. She has never used smokeless tobacco. She reports current alcohol use of about 1.0 standard drink of alcohol per week. She reports that she does not use drugs.  Physical Exam: BP (!) 160/95 (BP Location: Left Arm, Patient Position: Sitting, Cuff Size: Normal)   Pulse 97   Ht  5' 2 (1.575 m)   Wt 137 lb (62.1 kg)   SpO2 99%   BMI 25.06 kg/m    Constitutional:  Alert and oriented, No acute distress. Cardiovascular: No clubbing, cyanosis, or edema. Respiratory: Normal respiratory effort, no increased work of breathing. GI: Abdomen is soft, nontender, nondistended, no abdominal masses   Laboratory Data: Reviewed, see HPI Urinalysis today  Pertinent Imaging: I have personally viewed and interpreted the CT scan dated 10/12/2024 showing a 10 mm left renal pelvis stone(stone density 1100HU, SSD 9cm), no hydronephrosis, 3 mm right lower pole stone.  KUB today shows unchanged location of left renal pelvis stone and small right lower pole stone  Assessment & Plan:   69 year old  female with a number of symptoms including bilateral abdominal, flank, groin, and upper leg pain, and urinary symptoms with dysuria and urgency/frequency.  CT shows a nonobstructive 1 cm left renal pelvis stone, but this may be causing intermittent obstruction based on the location.  Urinalysis today concerning for UTI.  KUB today shows unchanged location of 1 cm left renal pelvis stone and nonobstructive 3 mm right lower pole stone.  I also recommended stopping vitamin C supplementation, as this may be contributing to recurrent stones.  No evidence of obstructive pyelonephritis that would warrant urgent stent placement with delayed stone treatment, clinically appears well.  We discussed various treatment options for urolithiasis including observation with or without medical expulsive therapy, shockwave lithotripsy (SWL), ureteroscopy and laser lithotripsy with stent placement, and percutaneous nephrolithotomy.We discussed that management is based on stone size, location, density, patient co-morbidities, and patient preference. Stones <60mm in size have a >80% spontaneous passage rate. Data surrounding the use of tamsulosin for medical expulsive therapy is controversial, but meta analyses suggests it is most efficacious for distal stones between 5-23mm in size. Possible side effects include dizziness/lightheadedness, and retrograde ejaculation.SWL has a lower stone free rate in a single procedure, but also a lower complication rate compared to ureteroscopy and avoids a stent and associated stent related symptoms. Possible complications include renal hematoma, steinstrasse, and need for additional treatment.Ureteroscopy with laser lithotripsy and stent placement has a higher stone free rate than SWL in a single procedure, however increased complication rate including possible infection, ureteral injury, bleeding, and stent related morbidity. Common stent related symptoms include dysuria, urgency/frequency, and flank  pain.  Bactrim  DS twice daily x 5 days for suspected UTI, follow-up culture Shockwave lithotripsy next week for left-sided 1 cm renal pelvis stone Will need to stop NSAIDs 3 days prior  [name], MD 10/24/2024  [facility] [address] [place], [place] [address] [phone]   "

## 2024-10-24 NOTE — Patient Instructions (Signed)
 ESWL for Kidney Stones  Extracorporeal shock wave lithotripsy (ESWL) is a treatment that can help break up kidney stones that are too large to pass on their own.  This is a nonsurgical procedure that breaks up a kidney stone with shock waves. These shock waves pass through your body and focus on the kidney stone. They cause the kidney stone to break into smaller pieces (fragments) while it is still in the urinary tract. The fragments of stone can pass more easily out of your body in the pee (urine). Tell a health care provider about: Any allergies you have. All medicines you are taking, including vitamins, herbs, eye drops, creams, and over-the-counter medicines. Any problems you or family members have had with anesthetic medicines. Any bleeding problems you have. Any surgeries you've had. Any medical conditions you have. Whether you're pregnant or may be pregnant. What are the risks? Your health care provider will talk with you about risks. These may include: Infection. Bleeding from the kidney. Bruising of the kidney or skin. Scarring of the kidney. This can lead to: Increased blood pressure. Poor kidney function. Return (recurrence) of kidney stones. Damage to other structures or organs. This may include the liver, colon, spleen, or pancreas. Blockage (obstruction) of the tube that carries pee from the kidney to the bladder (ureter). Failure of the kidney stone to break into fragments. What happens before the procedure? When to stop eating and drinking Follow instructions from your health care provider about what you may eat and drink. These may include: 8 hours before your procedure Stop eating most foods. Do not eat meat, fried foods, or fatty foods. Eat only light foods, such as toast or crackers. All liquids are okay except energy drinks and alcohol. 6 hours before your procedure Stop eating. Drink only clear liquids, such as water, clear fruit juice, black coffee, plain tea,  and sports drinks. Do not drink energy drinks or alcohol. 2 hours before your procedure Stop drinking all liquids. You may be allowed to take medicines with small sips of water. If you do not follow your health care provider's instructions, your procedure may be delayed or canceled. Medicines Ask your health care provider about: Changing or stopping your regular medicines. These include any diabetes medicines or blood thinners you take. Taking medicines such as aspirin and ibuprofen. These medicines can thin your blood. Do not take them unless your health care provider tells you to. Taking over-the-counter medicines, vitamins, herbs, and supplements. Tests You may have tests, such as: Blood tests. Pee (urine) tests. Imaging tests. This may include a CT scan. Surgery safety Ask your health care provider: How your surgery site will be marked. What steps will be taken to help prevent infection. These steps may include: Washing skin with a soap that kills germs. Receiving antibiotics. General instructions If you will be going home right after the procedure, plan to have a responsible adult: Take you home from the hospital or clinic. You will not be allowed to drive. Care for you for the time you are told. What happens during the procedure?  An IV will be inserted into one of your veins. You may be given: A sedative. This helps you relax. Anesthesia. This will: Numb certain areas of your body. Make you fall asleep for surgery. A water-filled cushion may be placed behind your kidney or on your abdomen. In some cases, you may be placed in a tub of lukewarm water. Your body will be positioned in a way that makes it  easier to target the kidney stone. An X-ray or ultrasound exam will be done to locate your stone. Shock waves will be aimed at the stone. If you are awake, you may feel a tapping sensation as the shock waves pass through your body. A small mesh tube (stent) may be placed in  your ureter. This will help keep pee flowing from the kidney if the fragments of the stone have been blocking the ureter. The stent will be removed at a later time by your health care provider. The procedure may vary among health care providers and hospitals. What happens after the procedure? Your blood pressure, heart rate, breathing rate, and blood oxygen level will be monitored until you leave the hospital or clinic. You may have an X-ray after the procedure to see how many of the kidney stones were broken up. This will also show how much of the stone has passed. If there are still large fragments after treatment, you may need to have a second procedure at a later time. This information is not intended to replace advice given to you by your health care provider. Make sure you discuss any questions you have with your health care provider. Document Revised: 04/16/2023 Document Reviewed: 02/04/2022 Elsevier Patient Education  2024 [employer].

## 2024-10-24 NOTE — H&P (View-Only) (Signed)
 "  10/24/2024 10:29 AM   Amanda Gonzales [birth year] [id number]  CC: Flank pain, kidney stones  HPI: 69 year old female who was seen by PCP 10/12/2024 with a few weeks of a number of symptoms including abdominal pain, hip pain, low back pain bilaterally, and nausea.  She had evaluation with a urinalysis which was relatively benign with 2 RBC and 5 WBC, trace leukocytes, nitrite negative, no bacteria, and a CT scan 10/12/2024 showed a nonobstructive 1 cm left renal pelvis stone with no hydronephrosis, and right 3 mm lower pole nonobstructive stone.  CBC and BMP were benign.  She reports a history of stones that previously required shockwave lithotripsy in [facility], she did have some difficulty tolerating shockwave, however was on high-dose narcotics after a recent jaw surgery at that time.  She reports new onset of some urinary symptoms including dysuria, urgency/frequency, as well as 1 day of gross hematuria.  She took Aleve at 1 AM on 10/24/2024.  She has not been taking her daily low-dose aspirin.  She was prescribed oxycodone  and Tylenol  3 by PCP which has taken some of the edge off of her pain.  Continues to report moderate to severe pain in bilateral flanks that goes to the lower abdomen and upper legs.  Urinalysis today concerning for UTI with 11-30 WBC, 3-10 RBC, moderate bacteria, 1+ leukocytes, nitrite negative.  Will send for culture.   PMH: Past Medical History:  Diagnosis Date   Abnormal Pap smear of cervix 1984   Anxiety    Arthritis    Cancer (HCC)    cervical   Cholelithiasis without cholecystitis    Compression fracture of L5 vertebra (HCC)    Depression    Family history of breast cancer in female    5/22 cancer genetic testing letter sent   History of kidney stones    Hypothyroidism    Long term current use of aspirin    Pneumonia    Psoriasis     Surgical History: Past Surgical History:  Procedure Laterality Date   APPENDECTOMY  1976   BACK SURGERY      BREAST SURGERY     breast implants   COLONOSCOPY     DILATION AND CURETTAGE OF UTERUS  1980   MAB   EYE SURGERY  2021   Cataract   facial surgery due to over bite   1980   INTRAOPERATIVE CHOLANGIOGRAM N/A 12/14/2023   Procedure: INTRAOPERATIVE CHOLANGIOGRAM;  Surgeon: Amanda Romano, MD;  Location: [facility] ORS;  Service: General;  Laterality: N/A;   IR KYPHO LUMBAR INC FX REDUCE BONE BX UNI/BIL CANNULATION INC/IMAGING  11/20/2021   IR RADIOLOGIST EVAL & MGMT  11/12/2021   IR RADIOLOGIST EVAL & MGMT  12/03/2021   Left L5-S1 lateral discectomy  02/2022   WISDOM TOOTH EXTRACTION     WRIST FRACTURE SURGERY Left 2017    Family History: Family History  Problem Relation Age of Onset   Breast cancer Brother 62   Stroke Brother    Thyroid disease Maternal Grandmother    Heart failure Mother    Heart failure Father     Social History:  reports that she has quit smoking. Her smoking use included cigarettes. She has never used smokeless tobacco. She reports current alcohol use of about 1.0 standard drink of alcohol per week. She reports that she does not use drugs.  Physical Exam: BP (!) 160/95 (BP Location: Left Arm, Patient Position: Sitting, Cuff Size: Normal)   Pulse 97   Ht  5' 2 (1.575 m)   Wt 137 lb (62.1 kg)   SpO2 99%   BMI 25.06 kg/m    Constitutional:  Alert and oriented, No acute distress. Cardiovascular: No clubbing, cyanosis, or edema. Respiratory: Normal respiratory effort, no increased work of breathing. GI: Abdomen is soft, nontender, nondistended, no abdominal masses   Laboratory Data: Reviewed, see HPI Urinalysis today  Pertinent Imaging: I have personally viewed and interpreted the CT scan dated 10/12/2024 showing a 10 mm left renal pelvis stone(stone density 1100HU, SSD 9cm), no hydronephrosis, 3 mm right lower pole stone.  KUB today shows unchanged location of left renal pelvis stone and small right lower pole stone  Assessment & Plan:   69 year old  female with a number of symptoms including bilateral abdominal, flank, groin, and upper leg pain, and urinary symptoms with dysuria and urgency/frequency.  CT shows a nonobstructive 1 cm left renal pelvis stone, but this may be causing intermittent obstruction based on the location.  Urinalysis today concerning for UTI.  KUB today shows unchanged location of 1 cm left renal pelvis stone and nonobstructive 3 mm right lower pole stone.  I also recommended stopping vitamin C supplementation, as this may be contributing to recurrent stones.  No evidence of obstructive pyelonephritis that would warrant urgent stent placement with delayed stone treatment, clinically appears well.  We discussed various treatment options for urolithiasis including observation with or without medical expulsive therapy, shockwave lithotripsy (SWL), ureteroscopy and laser lithotripsy with stent placement, and percutaneous nephrolithotomy.We discussed that management is based on stone size, location, density, patient co-morbidities, and patient preference. Stones <71mm in size have a >80% spontaneous passage rate. Data surrounding the use of tamsulosin  for medical expulsive therapy is controversial, but meta analyses suggests it is most efficacious for distal stones between 5-47mm in size. Possible side effects include dizziness/lightheadedness, and retrograde ejaculation.SWL has a lower stone free rate in a single procedure, but also a lower complication rate compared to ureteroscopy and avoids a stent and associated stent related symptoms. Possible complications include renal hematoma, steinstrasse, and need for additional treatment.Ureteroscopy with laser lithotripsy and stent placement has a higher stone free rate than SWL in a single procedure, however increased complication rate including possible infection, ureteral injury, bleeding, and stent related morbidity. Common stent related symptoms include dysuria, urgency/frequency, and flank  pain.  Bactrim  DS twice daily x 5 days for suspected UTI, follow-up culture Shockwave lithotripsy next week for left-sided 1 cm renal pelvis stone Will need to stop NSAIDs 3 days prior  Amanda Burnet, MD 10/24/2024  [facility] [address], [address] [place], [place] [address] [phone]   "

## 2024-10-24 NOTE — Progress Notes (Signed)
"  ° ° °  [facility] ESWL POSTING SHEET        Patient Name: Amanda Gonzales  DOB: 07/03/1956  MRN: [id number]  Surgeon:  Dr. Penne Skye, MD  Diagnosis:  Left Nephrolithiasis  CPT: 49409  ESWL DATE: 11/01/2024  ESWL TIME: 0845  Special Needs/Requirements: None       Cardiac/Medical/Pulmonary Clearance needed: no       Form Faxed to [facility]- [phone] Date:   Date: 10/24/2024       Form Faxed to [facility]- [phone]  Date:  Date: 10/24/2024           Copy Made for Insurance PA:  Date: 10/24/2024       Orders Entered in to Epic:  Date: 10/24/2024                   "

## 2024-10-24 NOTE — Progress Notes (Signed)
 ESWL ORDER FORM  Expected date of procedure: 1/15  Surgeon: MD on truck  Post op standing: 2-4wk follow up w/KUB prior  Anticoagulation/Aspirin/NSAID standing order: Hold all 72 hours prior  Anesthesia standing order: MAC  VTE standing: SCD's  Dx: Left Nephrolithiasis  Procedure: left Extracorporeal shock wave lithotripsy  CPT : 50590  Standing Order Set:   *NPO after mn, KUB  *NS 145ml/hr, Keflex 500mg  PO, Benadryl 25mg  PO, Valium 10mg  PO, Zofran  4mg  IV  Medications if other than standing orders:   NONE

## 2024-10-27 LAB — CULTURE, URINE COMPREHENSIVE

## 2024-10-29 ENCOUNTER — Telehealth: Payer: Self-pay

## 2024-10-29 NOTE — Telephone Encounter (Signed)
 Pt calls triage line and states she needs a refill on her RX for Tramadol . She states she is having to take a tablet every 4 hrs. Please advise.

## 2024-10-30 ENCOUNTER — Other Ambulatory Visit: Payer: Self-pay | Admitting: Urology

## 2024-10-30 MED ORDER — TRAMADOL HCL 50 MG PO TABS: 25.0000 mg | ORAL_TABLET | Freq: Four times a day (QID) | ORAL | 0 refills | Status: AC | PRN

## 2024-10-30 NOTE — Progress Notes (Signed)
 Chief Complaint:   Chief Complaint  Patient presents with   Follow-up    Subjective:   Amanda Gonzales is a 69 y.o. female in today for follow up today  History of Present Illness Amanda Anagnos is a 69 year old female with kidney stones who presents with left-sided kidney stone pain.  She has been experiencing significant pain due to a kidney stone in her left kidney, measuring approximately 10 mm. The pain began around Christmas and was initially thought to be related to her back surgeries and sciatica. However, the pain persisted and was later identified as being caused by the kidney stone when she started having bladder symptoms.  She was seen in the clinic and then had a CT scan done and a urology referral sent in..  She has a history of kidney stones and has undergone lithotripsy twice in the past. She has also passed stones naturally before.  Most recent CT scan did show 6 x 10 mm nonobstructing calculus in the left renal pelvis and bilateral nonobstructing renal calculi.  She is scheduled to have lithotripsy done in 2 days.  Urine analysis did show some bacteria but cultures were negative.  She is given Bactrim  which she will be finishing up tonight.     Approximately couple days ago, she noticed a greenish discharge, which she described as 'like a yeast infection' but not lumpy.  This happened when she urinated.  This discharge was light green and appeared on toilet tissue. She denies any recent sexual activity, having not engaged in sexual intercourse for almost a year, and questions the possibility of a sexually transmitted infection. He has stopped taking aspirin and all other NSAIDs in preparation for her procedure in 2 days   Current Outpatient Medications  Medication Sig Dispense Refill   acetaminophen -codeine (TYLENOL  #3) 300-30 mg tablet Take 1 tablet by mouth every 4 (four) hours as needed for Pain for up to 20 doses 20 tablet 0   ascorbic acid, vitamin C, (VITAMIN C) 500 MG  tablet Take 500 mg by mouth once daily     aspirin 81 MG EC tablet Take 81 mg by mouth once daily     DULoxetine  (CYMBALTA ) 30 MG DR capsule Take 1 capsule (30 mg total) by mouth once daily Take this along with the 60 mg of duloxetine  for a total of 90 mg/day 90 capsule 1   DULoxetine  (CYMBALTA ) 60 MG DR capsule Take 1 capsule (60 mg total) by mouth once daily 90 capsule 3   estrogens , esterified,-methylTESTOSTERone (ESTRATEST) 1.25-2.5 mg tablet Take 1 tablet by mouth once daily 30 tablet 0   gabapentin  (NEURONTIN ) 300 MG capsule Take 1 capsule (300 mg total) by mouth 3 (three) times daily 270 capsule 0   levothyroxine  (EUTHYROX ) 75 MCG tablet Take 1 tablet (75 mcg total) by mouth once daily Take on an empty stomach with a glass of water at least 30-60 minutes before breakfast. 90 tablet 3   magnesium 250 mg Tab Take 250 mg by mouth once daily     medroxyPROGESTERone  (PROVERA ) 2.5 MG tablet Take 1 tablet (2.5 mg total) by mouth once daily 90 tablet 3   ondansetron  (ZOFRAN ) 4 MG tablet Take 4 mg by mouth every 8 (eight) hours as needed for Nausea     vitamin E 400 UNIT capsule Take 400 Units by mouth once daily     No current facility-administered medications for this visit.    Allergies as of 10/30/2024 - Reviewed 10/30/2024  Allergen  Reaction Noted   Hydrocodone -acetaminophen  Rash 06/18/2017    Past Medical History:  Diagnosis Date   Anxiety    Depression    Hypothyroidism    Kidney stone    Thyroid disease     Past Surgical History:  Procedure Laterality Date   KYPHOPLASTY  11/20/2021   L5   Left L5-S1 far lateral discectomy  03/17/2022   Dr Reeves Daisy at [facility]   ROBOT ASSISTED LAPAROSCOPIC CHOLECYSTECTOMY  12/14/2023   Dr Lucas Catchings ---- w/ Cholangiogram   Colon @ [facility]  02/17/2024   Normal examined colon/Repeat 98yrs/TKT   EGD @ [facility]  02/17/2024   Reflux esophagitis/No repeat/TKT   APPENDECTOMY       Family History  Problem Relation  Name Age of Onset   Heart disease Mother     Heart disease Father     No Known Problems Sister     Heart disease Brother     Stroke Brother      Social History:  reports that she quit smoking about 13 years ago. Her smoking use included cigarettes. She has never used smokeless tobacco. She reports current alcohol use. She reports that she does not use drugs.  Results for orders placed or performed in visit on 10/12/24  Urine Culture, Routine - [facility]   Specimen: Urine, Clean Catch  Result Value Ref Range   Urine Culture, Routine - [facility] Final report    Result 1 - [facility] Comment    Narrative   Performed at:  [id number], [place], [place]  [id number] Lab Director: Frankey Sas MD, Phone:  [phone]  External Radiology Result -CT   Narrative   Ordered by an unspecified provider.  CBC w/auto Differential (5 Part)  Result Value Ref Range   WBC (White Blood Cell Count) 5.9 4.1 - 10.2 103/uL   RBC (Red Blood Cell Count) 4.51 4.04 - 5.48 106/uL   Hemoglobin 14.1 12.0 - 15.0 gm/dL   Hematocrit 58.4 64.9 - 47.0 %   MCV (Mean Corpuscular Volume) 92.0 80.0 - 100.0 fl   MCH (Mean Corpuscular Hemoglobin) 31.3 (H) 27.0 - 31.2 pg   MCHC (Mean Corpuscular Hemoglobin Concentration) 34.0 32.0 - 36.0 gm/dL   Platelet Count 670 849 - 450 103/uL   RDW-CV (Red Cell Distribution Width) 12.8 11.6 - 14.8 %   MPV (Mean Platelet Volume) 9.3 (L) 9.4 - 12.4 fl   Neutrophils 3.03 1.50 - 7.80 103/uL   Lymphocytes 1.79 1.00 - 3.60 103/uL   Monocytes 0.67 0.00 - 1.50 103/uL   Eosinophils 0.34 0.00 - 0.55 103/uL   Basophils 0.01 0.00 - 0.09 103/uL   Neutrophil % 51.7 32.0 - 70.0 %   Lymphocyte % 30.6 10.0 - 50.0 %   Monocyte % 11.5 4.0 - 13.0 %   Eosinophil % 5.8 (H) 1.0 - 5.0 %   Basophil% 0.2 0.0 - 2.0 %   Immature Granulocyte % 0.2 <=0.7 %   Immature Granulocyte Count 0.01 <=0.06 10^3/L  Comprehensive Metabolic Panel (CMP)  Result Value Ref Range    Glucose 111 (H) 70 - 110 mg/dL   Sodium 861 863 - 854 mmol/L   Potassium 4.7 3.6 - 5.1 mmol/L   Chloride 105 97 - 109 mmol/L   Carbon Dioxide (CO2) 28.7 22.0 - 32.0 mmol/L   Urea Nitrogen (BUN) 16 7 - 25 mg/dL   Creatinine 0.8 0.6 - 1.1 mg/dL   Glomerular Filtration Rate (eGFR) 80 >60 mL/min/1.73sq m   Calcium 9.3  8.7 - 10.3 mg/dL   AST  15 8 - 39 U/L   ALT  13 5 - 38 U/L   Alk Phos (alkaline Phosphatase) 89 34 - 104 U/L   Albumin 4.3 3.5 - 4.8 g/dL   Bilirubin, Total 0.4 0.3 - 1.2 mg/dL   Protein, Total 6.2 6.1 - 7.9 g/dL   A/G Ratio 2.3 1.0 - 5.0 gm/dL  Urinalysis w/Microscopic  Result Value Ref Range   Color Light Yellow Colorless, Straw, Light Yellow, Yellow, Dark Yellow   Clarity Clear Clear   Specific Gravity 1.009 1.005 - 1.030   pH, Urine 6.5 5.0 - 8.0   Protein, Urinalysis Negative Negative mg/dL   Glucose, Urinalysis Negative Negative mg/dL   Ketones, Urinalysis Negative Negative mg/dL   Blood, Urinalysis 1+ (!) Negative   Nitrite, Urinalysis Negative Negative   Leukocyte Esterase, Urinalysis Trace (!) Negative   Bilirubin, Urinalysis Negative Negative   Urobilinogen, Urinalysis 0.2 0.2 - 1.0 mg/dL   WBC, UA 5 <=5 /hpf   Red Blood Cells, Urinalysis 2 <=3 /hpf   Bacteria, Urinalysis 0-5 0 - 5 /hpf   Squamous Epithelial Cells, Urinalysis 0 /hpf      ROS:  General: No fever, chills or recent illness. No change in weight  HEENT: No change in vision or hearing. No pain or difficulty with swallowing Respiratory: No cough or shortness of breath CV:  No chest pain or palpitations GI:  No pain, dyspepsia or change in bowel habits GU:  Improved dysuria, some greenish vaginal discharge MSK:  No joint pain or injury Neurological: No headaches, changes in mental status, loss of sensation or strength   Psychological: No anxiety insomnia or depression  Objective:   Body mass index is 25.72 kg/m.  BP 138/88   Pulse 97   Ht 157.5 cm (5' 2)   Wt 63.8 kg (140 lb 9.6  oz)   SpO2 98%   BMI 25.72 kg/m   General: WD/WN female, in no acute distress HEENT: Pupils equal and round, EOMI.   Neck: supple, trachea midline; no thyromegaly Respiratory:clear to auscultation.  No dullness to percussion.  No use of accessory muscles. Cardiac:  Regular rate and rhythm without murmur, gallops, or rubs  Abdominal:soft, nontender, positive bowel sounds.  No organomegaly. Musculoskeletal:  No clubbing, cyanosis or edema.  Full range of motion in upper and lower extremities bilaterally. Neuro: CN grossly intact.  No acute decrease in sensation in the upper and lower extremities bilaterally.  Lymph: no cervical or supraclavicular lymphadenopathy  -Pelvic exam::              External genitalia: vulva and labia without lesions              Urethra: no prolapse Finger pelvic exam done, minimal amount of lubricant used, followed by insertion of a vaginal speculum, size large.  No bleeding or discharge noted.             Vagina: normal physiologic, thin greenish discharge, pink walls and normal folds             Cervix: no lesions, no cervical motion tenderness               Uterus: normal size shape and contour, non-tender             Adnexa: no mass,  non-tender   Vaginal swab taken -.  No procedure complications.    Assessment/Plan:   Vaginal discharge  (primary encounter diagnosis) Mixed hyperlipidemia Gastroesophageal  reflux disease without esophagitis Dysuria Nephrolithiasis Screening for chlamydial disease Encounter for screening for infections with a predominantly sexual mode of transmission  Assessment & Plan  Nephrolithiasis -Lateral nonobstructing tiny kidney stones and a 10 mm kidney stone on the left side which is causing significant pain - Following with urology.  CT of the kidneys reviewed - Scheduled for lithotripsy in 2 days - Finishing Bactrim  today - Recheck urine analysis and also rule out other gynecological infections prior to the procedure   - Avoid NSAIDs or aspirin prior to procedure.  2.  Vaginal discharge- - Possible bacterial vaginosis with green discharge,  -Has not been sexually active -- Performed swab test to confirm diagnosis and rule out infection before lithotripsy.  3.  Chronic low back pain- - Stable on duloxetine  and gabapentin .    Follow-up with me as scheduled in 2 months for annual physical.  Sooner for acute issues     Goals       Follow my doctor's care plan     * Reduce Stress/Anxiety (pt-stated)        LAVENIA BEAVER, MD  Portions of this note were created using dictation software and may contain typographical errors.  *Some images could not be shown.

## 2024-11-01 ENCOUNTER — Encounter: Admission: RE | Disposition: A | Payer: Self-pay | Source: Home / Self Care | Attending: Urology

## 2024-11-01 ENCOUNTER — Other Ambulatory Visit: Payer: Self-pay

## 2024-11-01 ENCOUNTER — Ambulatory Visit
Admission: RE | Admit: 2024-11-01 | Discharge: 2024-11-01 | Disposition: A | Discharge status: Home / Self Care | Attending: Urology | Admitting: Urology

## 2024-11-01 ENCOUNTER — Ambulatory Visit

## 2024-11-01 ENCOUNTER — Encounter: Payer: Self-pay | Admitting: Urology

## 2024-11-01 DIAGNOSIS — N200 Calculus of kidney: Secondary | ICD-10-CM | POA: Diagnosis present

## 2024-11-01 HISTORY — PX: EXTRACORPOREAL SHOCK WAVE LITHOTRIPSY: SHX1557

## 2024-11-01 MED ORDER — TRAMADOL HCL 50 MG PO TABS: 50.0000 mg | ORAL_TABLET | Freq: Four times a day (QID) | ORAL | 0 refills | Status: AC | PRN

## 2024-11-01 MED ORDER — SODIUM CHLORIDE 0.9 % IV SOLN: INTRAVENOUS | Status: DC

## 2024-11-01 MED ORDER — CEPHALEXIN 500 MG PO CAPS
ORAL_CAPSULE | ORAL | Status: AC
  Filled 2024-11-01: qty 1

## 2024-11-01 MED ORDER — TAMSULOSIN HCL 0.4 MG PO CAPS: 0.4000 mg | ORAL_CAPSULE | Freq: Every day | ORAL | 0 refills | Status: AC

## 2024-11-01 MED ORDER — DIPHENHYDRAMINE HCL 25 MG PO CAPS
ORAL_CAPSULE | ORAL | Status: AC
  Filled 2024-11-01: qty 1

## 2024-11-01 MED ORDER — ONDANSETRON HCL 4 MG/2ML IJ SOLN
4.0000 mg | Freq: Once | INTRAMUSCULAR | Status: AC
  Administered 2024-11-01: 4 mg via INTRAVENOUS

## 2024-11-01 MED ORDER — DIAZEPAM 5 MG PO TABS
10.0000 mg | ORAL_TABLET | ORAL | Status: AC
  Administered 2024-11-01: 10 mg via ORAL

## 2024-11-01 MED ORDER — DIPHENHYDRAMINE HCL 25 MG PO CAPS
25.0000 mg | ORAL_CAPSULE | ORAL | Status: AC
  Administered 2024-11-01: 25 mg via ORAL

## 2024-11-01 MED ORDER — PHENAZOPYRIDINE HCL 200 MG PO TABS: 200.0000 mg | ORAL_TABLET | Freq: Three times a day (TID) | ORAL | 0 refills | Status: AC | PRN

## 2024-11-01 MED ORDER — DIAZEPAM 5 MG PO TABS
ORAL_TABLET | ORAL | Status: AC
  Filled 2024-11-01: qty 2

## 2024-11-01 MED ORDER — CEPHALEXIN 500 MG PO CAPS
500.0000 mg | ORAL_CAPSULE | Freq: Once | ORAL | Status: AC
  Administered 2024-11-01: 500 mg via ORAL

## 2024-11-01 MED ORDER — ONDANSETRON HCL 4 MG/2ML IJ SOLN
INTRAMUSCULAR | Status: AC
  Filled 2024-11-01: qty 2

## 2024-11-01 NOTE — Discharge Instructions (Signed)
 See [facility] discharge instructions in chart.  F/u in 2-3 weeks with KUB prior in the Urology clinic

## 2024-11-01 NOTE — Interval H&P Note (Addendum)
 History and Physical Interval Note:  11/01/2024 9:36 AM  Amanda Gonzales  has presented today for surgery, with the diagnosis of Left Nephrolithiasis.  The various methods of treatment have been discussed with the patient and family. After consideration of risks, benefits and other options for treatment, the patient has consented to  Procedures: LITHOTRIPSY, ESWL (Left) as a surgical intervention.  The patient's history has been reviewed, patient examined, no change in status, stable for surgery.  I have reviewed the patient's chart and labs.  Questions were answered to the patient's satisfaction.    General: no acute distress, alert/oriented, conversational  HEENT: equal nondilated pupils CV: regular rate Lung: unlabored breathing, regular rate and rhythm  Abd: nondistended, nontender with palpation, no palpable masses  MSK: moving all extremities without issue, normal observed motor function  Plan to treat ~87mm LEFT renal stone, possible adjacent ~44mm LEFT renal pelvic stone via ESWL today No changes to HPI Consents signed   Amanda Gonzales

## 2024-11-01 NOTE — Brief Op Note (Signed)
 11/01/2024  10:40 AM  PATIENT:  Amanda Gonzales  68 y.o. female  PRE-OPERATIVE DIAGNOSIS:  Left Nephrolithiasis  POST-OPERATIVE DIAGNOSIS:  Left Nephrolithiasis  PROCEDURE:  Procedures: LITHOTRIPSY, ESWL (Left)  SURGEON:  Surgeons and Role:    * Georganne Penne SAUNDERS, MD - Primary  ANESTHESIA: Conscious Sedation  EBL:  None  Drains: None  Specimen: None  Findings:  1.Successful ESWL treatment of ~40mm + 3mm Left renal pelvic stones - complete radiographic dissolution  DISPO: Flomax , pain meds PRN, RTC 2 weeks KUB  Penne Georganne, MD 11/01/2024

## 2024-11-02 ENCOUNTER — Other Ambulatory Visit: Payer: Self-pay

## 2024-11-02 ENCOUNTER — Encounter: Payer: Self-pay | Admitting: Urology

## 2024-11-02 DIAGNOSIS — N200 Calculus of kidney: Secondary | ICD-10-CM

## 2024-11-19 ENCOUNTER — Ambulatory Visit: Admitting: Urology

## 2024-11-19 DIAGNOSIS — N200 Calculus of kidney: Secondary | ICD-10-CM

## 2024-11-23 NOTE — Progress Notes (Unsigned)
 11/26/2024 10:32 AM   Amanda Gonzales 13-Mar-1956 [id number]  Referring provider: Sherial Bail, MD [address] [place],  [place] [address]  Urological history: 1. Nephrolithiasis - personal history of several ESWL's in [facility] - Left ESWL (10/2024)   No chief complaint on file.  HPI: Amanda Gonzales is a 69 y.o. who is status post ESWL who presents today for follow up.  The patient presents for follow-up after undergoing extracorporeal shock wave lithotripsy (ESWL) for a previously identified left renal pelvic calculus. She reports doing well overall. No flank pain, hematuria, dysuria, fever, or urinary urgency/frequency. She denies passing any visible stone fragments since the procedure but notes improvement in prior discomfort. ***  KUB ***  UA ***  PMH: Past Medical History:  Diagnosis Date   Abnormal Pap smear of cervix 1984   Anxiety    Arthritis    Cancer (HCC)    cervical   Cholelithiasis without cholecystitis    Compression fracture of L5 vertebra (HCC)    Depression    Family history of breast cancer in female    5/22 cancer genetic testing letter sent   History of kidney stones    Hypothyroidism    Long term current use of aspirin    Pneumonia    Psoriasis     Surgical History: Past Surgical History:  Procedure Laterality Date   APPENDECTOMY  1976   BACK SURGERY     BREAST SURGERY     breast implants   COLONOSCOPY     DILATION AND CURETTAGE OF UTERUS  1980   [facility]AB   EXTRACORPOREAL SHOCK WAVE LITHOTRIPSY Left 11/01/2024   Procedure: LITHOTRIPSY, ESWL;  Surgeon: Georganne Penne SAUNDERS, MD;  Location: [facility] ORS;  Service: Urology;  Laterality: Left;   EYE SURGERY  2021   Cataract   facial surgery due to over bite   1980   INTRAOPERATIVE CHOLANGIOGRAM N/A 12/14/2023   Procedure: INTRAOPERATIVE CHOLANGIOGRAM;  Surgeon: Rodolph Romano, MD;  Location: [facility] ORS;  Service: General;  Laterality: N/A;   IR KYPHO LUMBAR INC FX REDUCE BONE BX  UNI/BIL CANNULATION INC/IMAGING  11/20/2021   IR RADIOLOGIST EVAL & MGMT  11/12/2021   IR RADIOLOGIST EVAL & MGMT  12/03/2021   Left L5-S1 lateral discectomy  02/2022   WISDOM TOOTH EXTRACTION     WRIST FRACTURE SURGERY Left 2017    Home Medications:  Medications Ordered Prior to Encounter[1]  Allergies: Allergies[2]  Family History: Family History  Problem Relation Age of Onset   Breast cancer Brother 67   Stroke Brother    Thyroid disease Maternal Grandmother    Heart failure Mother    Heart failure Father     Social History:  reports that she has quit smoking. Her smoking use included cigarettes. She has never used smokeless tobacco. She reports current alcohol use of about 1.0 standard drink of alcohol per week. She reports that she does not use drugs.  ROS: Pertinent ROS in HPI  Physical Exam: There were no vitals taken for this visit.  Constitutional:  Well nourished. Alert and oriented, No acute distress. HEENT: [place] AT, mask in place.   Trachea midline. Cardiovascular: No clubbing, cyanosis, or edema. Respiratory: Normal respiratory effort, no increased work of breathing. Neurologic: Grossly intact, no focal deficits, moving all 4 extremities. Psychiatric: Normal mood and affect.  Laboratory Data: See Epic and HPI   I have reviewed the labs.   Pertinent Imaging: KUB, radiologist interpretation still pending.   I have  independently reviewed the films.  See HPI.   Assessment: 69 year old woman with history of left renal pelvic calculus, now status post ESWL with complete radiographic resolution. Clinically asymptomatic. No evidence of residual stone burden or complications.  Plan: - Reassurance provided regarding successful stone clearance. - Encourage ongoing hydration (>=2-2.5 L/day) to reduce recurrence risk. - Discussed dietary stone-prevention strategies tailored to stone type if known (e.g., limiting sodium, moderating animal protein, maintaining normal  calcium intake). - Continue routine activity as tolerated. - Return to clinic if symptoms recur (flank pain, hematuria, fever). - Offered 24 hour metabolic work up  - Follow-up PRN or in 6-12 months with repeat imaging if clinically indicated.  No follow-ups on file.  These notes generated with voice recognition software. I apologize for typographical errors.  Amanda Gonzales  [facility] [address]  [address] [place], [place] [address] [phone]       [1]  Current Outpatient Medications on File Prior to Visit  Medication Sig Dispense Refill   acetaminophen  (TYLENOL ) 325 MG tablet Take 650 mg by mouth every 6 (six) hours as needed for moderate pain (pain score 4-6).     ascorbic acid (VITAMIN C) 500 MG tablet Take 500 mg by mouth daily.     aspirin EC 81 MG tablet Take 81 mg by mouth daily. Swallow whole.     DULoxetine  (CYMBALTA ) 60 MG capsule Take 1 capsule (60 mg total) by mouth daily. 30 capsule 11   estrogens -methylTEST (ESTRATEST) 1.25-2.5 MG tablet Take 1 tablet by mouth once daily 30 tablet 0   famotidine  (PEPCID ) 40 MG tablet Take 40 mg by mouth at bedtime.     gabapentin  (NEURONTIN ) 300 MG capsule Take 1 capsule (300 mg total) by mouth daily. (Patient taking differently: Take 300 mg by mouth at bedtime.) 30 capsule 1   Homeopathic Products (ALLERGY MEDICINE PO) Take 8 mg by mouth at bedtime. 4 mg each     levothyroxine  (SYNTHROID ) 75 MCG tablet Take 1 tablet (75 mcg total) by mouth daily before breakfast. 30 tablet 11   Magnesium 250 MG TABS Take 250 mg by mouth daily.     medroxyPROGESTERone  (PROVERA ) 2.5 MG tablet Take 2.5 mg by mouth daily.     pantoprazole (PROTONIX) 20 MG tablet Take 20 mg by mouth as needed.     sulfamethoxazole -trimethoprim  (BACTRIM  DS) 800-160 MG tablet Take 1 tablet by mouth every 12 (twelve) hours. (Patient not taking: Reported on 11/01/2024) 10 tablet 0   traMADol  (ULTRAM ) 50 MG tablet Take 50 mg by mouth  every 12 (twelve) hours as needed for moderate pain (pain score 4-6) or severe pain (pain score 7-10).     traMADol  (ULTRAM ) 50 MG tablet Take 0.5-1 tablets (25-50 mg total) by mouth every 6 (six) hours as needed for severe pain (pain score 7-10). 12 tablet 0   traMADol  (ULTRAM ) 50 MG tablet Take 0.5-1 tablets (25-50 mg total) by mouth every 6 (six) hours as needed for severe pain (pain score 7-10). 12 tablet 0   triamcinolone 0.5%-Eucerin equivalent 1:1 cream mixture Apply 1 Application topically as needed for rash.     vitamin E 180 MG (400 UNITS) capsule Take 400 Units by mouth daily.     No current facility-administered medications on file prior to visit.  [2] No Known Allergies

## 2024-11-26 ENCOUNTER — Ambulatory Visit: Admitting: Urology

## 2024-11-26 DIAGNOSIS — N200 Calculus of kidney: Secondary | ICD-10-CM
